# Patient Record
Sex: Male | Born: 1955
Health system: Southern US, Community
[De-identification: ages and names within clinical notes are randomized; demographics above are authoritative.]

## PROBLEM LIST (undated history)

## (undated) DIAGNOSIS — G4730 Sleep apnea, unspecified: Secondary | ICD-10-CM

## (undated) DIAGNOSIS — M1990 Unspecified osteoarthritis, unspecified site: Secondary | ICD-10-CM

## (undated) DIAGNOSIS — E785 Hyperlipidemia, unspecified: Secondary | ICD-10-CM

## (undated) DIAGNOSIS — E669 Obesity, unspecified: Secondary | ICD-10-CM

## (undated) HISTORY — PX: KNEE ARTHROSCOPY: SUR90

## (undated) HISTORY — PX: REFRACTIVE SURGERY: SHX103

## (undated) HISTORY — DX: Hyperlipidemia, unspecified: E78.5

## (undated) HISTORY — DX: Obesity, unspecified: E66.9

## (undated) HISTORY — PX: HERNIA REPAIR: SHX51

---

## 2004-12-25 ENCOUNTER — Ambulatory Visit (HOSPITAL_COMMUNITY): Admission: RE | Admit: 2004-12-25 | Discharge: 2004-12-25 | Payer: Self-pay | Admitting: Specialist

## 2013-07-26 DIAGNOSIS — Z872 Personal history of diseases of the skin and subcutaneous tissue: Secondary | ICD-10-CM | POA: Insufficient documentation

## 2014-08-17 DIAGNOSIS — G4733 Obstructive sleep apnea (adult) (pediatric): Secondary | ICD-10-CM | POA: Insufficient documentation

## 2014-08-17 DIAGNOSIS — E6609 Other obesity due to excess calories: Secondary | ICD-10-CM | POA: Diagnosis present

## 2014-08-17 DIAGNOSIS — E669 Obesity, unspecified: Secondary | ICD-10-CM | POA: Diagnosis present

## 2016-06-11 DIAGNOSIS — K4090 Unilateral inguinal hernia, without obstruction or gangrene, not specified as recurrent: Secondary | ICD-10-CM | POA: Insufficient documentation

## 2017-05-27 NOTE — Progress Notes (Signed)
Clearance on chart from Marylene Landaniel Bryan, Pac 04/29/17 on chart  EKG 04/29/17 on chart

## 2017-05-27 NOTE — Patient Instructions (Addendum)
James Burch  05/27/2017   Your procedure is scheduled on: 06/02/17  Report to [facility] Main  Entrance   Report to admitting at      1045  AM   Call this number if you have problems the morning of surgery  [phone]   Remember: ONLY 1 PERSON MAY GO WITH YOU TO SHORT STAY TO GET  READY MORNING OF YOUR SURGERY.  Do not eat food  :After Midnight.  YOU MAY HAVE CLEAR LIQUIDS UNTIL 0715 THEN NOTHING BY MOUTH     Take these medicines the morning of surgery with A SIP OF WATER: none                                You may not have any metal on your body including hair pins and              piercings  Do not wear jewelry,, lotions, powders or perfumes, deodorant               Men may shave face and neck.   Do not bring valuables to the hospital. [facility] IS NOT             RESPONSIBLE   FOR VALUABLES.  Contacts, dentures or bridgework may not be worn into surgery.  Leave suitcase in the car. After surgery it may be brought to your room.                 Please read over the following fact sheets you were given: _____________________________________________________________________           [facility] - Preparing for Surgery Before surgery, you can play an important role.  Because skin is not sterile, your skin needs to be as free of germs as possible.  You can reduce the number of germs on your skin by washing with CHG (chlorahexidine gluconate) soap before surgery.  CHG is an antiseptic cleaner which kills germs and bonds with the skin to continue killing germs even after washing. Please DO NOT use if you have an allergy to CHG or antibacterial soaps.  If your skin becomes reddened/irritated stop using the CHG and inform your nurse when you arrive at Short Stay. Do not shave (including legs and underarms) for at least 48 hours prior to the first CHG shower.  You may shave your face/neck. Please follow these instructions carefully:  1.  Shower  with CHG Soap the night before surgery and the  morning of Surgery.  2.  If you choose to wash your hair, wash your hair first as usual with your  normal  shampoo.  3.  After you shampoo, rinse your hair and body thoroughly to remove the  shampoo.                           4.  Use CHG as you would any other liquid soap.  You can apply chg directly  to the skin and wash                       Gently with a scrungie or clean washcloth.  5.  Apply the CHG Soap to your body ONLY FROM THE NECK DOWN.   Do not use on face/ open  Wound or open sores. Avoid contact with eyes, ears mouth and genitals (private parts).                       Wash face,  Genitals (private parts) with your normal soap.             6.  Wash thoroughly, paying special attention to the area where your surgery  will be performed.  7.  Thoroughly rinse your body with warm water from the neck down.  8.  DO NOT shower/wash with your normal soap after using and rinsing off  the CHG Soap.                9.  Pat yourself dry with a clean towel.            10.  Wear clean pajamas.            11.  Place clean sheets on your bed the night of your first shower and do not  sleep with pets. Day of Surgery : Do not apply any lotions/deodorants the morning of surgery.  Please wear clean clothes to the hospital/surgery center.  FAILURE TO FOLLOW THESE INSTRUCTIONS MAY RESULT IN THE CANCELLATION OF YOUR SURGERY PATIENT SIGNATURE_________________________________  NURSE SIGNATURE__________________________________  ________________________________________________________________________    CLEAR LIQUID DIET  Till 0715 am then nothing by mouth   Foods Allowed                                                                     Foods Excluded  Coffee and tea, regular and decaf                             liquids that you cannot  Plain Jell-O in any flavor                                             see through such  as: Fruit ices (not with fruit pulp)                                     milk, soups, orange juice  Iced Popsicles                                    All solid food Carbonated beverages, regular and diet                                    Cranberry, grape and apple juices Sports drinks like Gatorade Lightly seasoned clear broth or consume(fat free) Sugar, honey syrup  Sample Menu Breakfast                                Lunch  Supper Cranberry juice                    Beef broth                            Chicken broth Jell-O                                     Grape juice                           Apple juice Coffee or tea                        Jell-O                                      Popsicle                                                Coffee or tea                        Coffee or tea  _____________________________________________________________________    Incentive Spirometer  An incentive spirometer is a tool that can help keep your lungs clear and active. This tool measures how well you are filling your lungs with each breath. Taking long deep breaths may help reverse or decrease the chance of developing breathing (pulmonary) problems (especially infection) following:  A long period of time when you are unable to move or be active. BEFORE THE PROCEDURE   If the spirometer includes an indicator to show your best effort, your nurse or respiratory therapist will set it to a desired goal.  If possible, sit up straight or lean slightly forward. Try not to slouch.  Hold the incentive spirometer in an upright position. INSTRUCTIONS FOR USE  1. Sit on the edge of your bed if possible, or sit up as far as you can in bed or on a chair. 2. Hold the incentive spirometer in an upright position. 3. Breathe out normally. 4. Place the mouthpiece in your mouth and seal your lips tightly around it. 5. Breathe in slowly and as deeply as possible,  raising the piston or the ball toward the top of the column. 6. Hold your breath for 3-5 seconds or for as long as possible. Allow the piston or ball to fall to the bottom of the column. 7. Remove the mouthpiece from your mouth and breathe out normally. 8. Rest for a few seconds and repeat Steps 1 through 7 at least 10 times every 1-2 hours when you are awake. Take your time and take a few normal breaths between deep breaths. 9. The spirometer may include an indicator to show your best effort. Use the indicator as a goal to work toward during each repetition. 10. After each set of 10 deep breaths, practice coughing to be sure your lungs are clear. If you have an incision (the cut made at the time of surgery), support your incision when coughing by placing a pillow or rolled up towels firmly against  it. Once you are able to get out of bed, walk around indoors and cough well. You may stop using the incentive spirometer when instructed by your caregiver.  RISKS AND COMPLICATIONS  Take your time so you do not get dizzy or light-headed.  If you are in pain, you may need to take or ask for pain medication before doing incentive spirometry. It is harder to take a deep breath if you are having pain. AFTER USE  Rest and breathe slowly and easily.  It can be helpful to keep track of a log of your progress. Your caregiver can provide you with a simple table to help with this. If you are using the spirometer at home, follow these instructions: SEEK MEDICAL CARE IF:   You are having difficultly using the spirometer.  You have trouble using the spirometer as often as instructed.  Your pain medication is not giving enough relief while using the spirometer.  You develop fever of 100.5 F (38.1 C) or higher. SEEK IMMEDIATE MEDICAL CARE IF:   You cough up bloody sputum that had not been present before.  You develop fever of 102 F (38.9 C) or greater.  You develop worsening pain at or near the  incision site. MAKE SURE YOU:   Understand these instructions.  Will watch your condition.  Will get help right away if you are not doing well or get worse. Document Released: 11/24/2006 Document Revised: 10/06/2011 Document Reviewed: 01/25/2007 Exit[employer] Patient Information 2014 [employer], [place].   ________________________________________________________________________  WHAT IS A BLOOD TRANSFUSION? Blood Transfusion Information  A transfusion is the replacement of blood or some of its parts. Blood is made up of multiple cells which provide different functions.  Red blood cells carry oxygen and are used for blood loss replacement.  White blood cells fight against infection.  Platelets control bleeding.  Plasma helps clot blood.  Other blood products are available for specialized needs, such as hemophilia or other clotting disorders. BEFORE THE TRANSFUSION  Who gives blood for transfusions?   Healthy volunteers who are fully evaluated to make sure their blood is safe. This is blood bank blood. Transfusion therapy is the safest it has ever been in the practice of medicine. Before blood is taken from a donor, a complete history is taken to make sure that person has no history of diseases nor engages in risky social behavior (examples are intravenous drug use or sexual activity with multiple partners). The donor's travel history is screened to minimize risk of transmitting infections, such as malaria. The donated blood is tested for signs of infectious diseases, such as HIV and hepatitis. The blood is then tested to be sure it is compatible with you in order to minimize the chance of a transfusion reaction. If you or a relative donates blood, this is often done in anticipation of surgery and is not appropriate for emergency situations. It takes many days to process the donated blood. RISKS AND COMPLICATIONS Although transfusion therapy is very safe and saves many lives, the main dangers  of transfusion include:   Getting an infectious disease.  Developing a transfusion reaction. This is an allergic reaction to something in the blood you were given. Every precaution is taken to prevent this. The decision to have a blood transfusion has been considered carefully by your caregiver before blood is given. Blood is not given unless the benefits outweigh the risks. AFTER THE TRANSFUSION  Right after receiving a blood transfusion, you will usually feel much better and more  energetic. This is especially true if your red blood cells have gotten low (anemic). The transfusion raises the level of the red blood cells which carry oxygen, and this usually causes an energy increase.  The nurse administering the transfusion will monitor you carefully for complications. HOME CARE INSTRUCTIONS  No special instructions are needed after a transfusion. You may find your energy is better. Speak with your caregiver about any limitations on activity for underlying diseases you may have. SEEK MEDICAL CARE IF:   Your condition is not improving after your transfusion.  You develop redness or irritation at the intravenous (IV) site. SEEK IMMEDIATE MEDICAL CARE IF:  Any of the following symptoms occur over the next 12 hours:  Shaking chills.  You have a temperature by mouth above 102 F (38.9 C), not controlled by medicine.  Chest, back, or muscle pain.  People around you feel you are not acting correctly or are confused.  Shortness of breath or difficulty breathing.  Dizziness and fainting.  You get a rash or develop hives.  You have a decrease in urine output.  Your urine turns a dark color or changes to pink, red, or brown. Any of the following symptoms occur over the next 10 days:  You have a temperature by mouth above 102 F (38.9 C), not controlled by medicine.  Shortness of breath.  Weakness after normal activity.  The white part of the eye turns yellow (jaundice).  You  have a decrease in the amount of urine or are urinating less often.  Your urine turns a dark color or changes to pink, red, or brown. Document Released: 07/11/2000 Document Revised: 10/06/2011 Document Reviewed: 02/28/2008 [facility] Patient Information 2014 [place], [place].  _______________________________________________________________________

## 2017-05-28 NOTE — H&P (Signed)
TOTAL KNEE ADMISSION H&P  Patient is being admitted for bilateral total knee arthroplasties.  Subjective:  Chief Complaint:    Bilateral knee primary OA / pain  HPI: James Burch, 61 y.o. male, has a history of pain and functional disability in the bilateral knee due to arthritis and has failed non-surgical conservative treatments for greater than 12 weeks to include NSAID's and/or analgesics, corticosteriod injections and activity modification.  Onset of symptoms was gradual, starting  years ago with gradually worsening course since that time. The patient noted prior procedures on the knee to include  arthroscopy and menisectomy on the left knee(s).  Patient currently rates pain in the bilateral knee(s) at 7 out of 10 with activity. Patient has night pain, worsening of pain with activity and weight bearing, pain that interferes with activities of daily living, pain with passive range of motion, crepitus and joint swelling.  Patient has evidence of periarticular osteophytes and joint space narrowing by imaging studies.  There is no active infection.  Risks, benefits and expectations were discussed with the patient.  Risks including but not limited to the risk of anesthesia, blood clots, nerve damage, blood vessel damage, failure of the prosthesis, infection and up to and including death.  Patient understand the risks, benefits and expectations and wishes to proceed with surgery.   PCP: Etta Quill, PA-C  D/C Plans:       Home w/ HHPT  Post-op Meds:       No Rx given  Tranexamic Acid:      To be given - IV   Decadron:      Is to be given  FYI:     Xarelto then ASA  Oxycodone  CPAP   DME:   Rx given for - RW   PT:   HHPT      Past Medical History:  Diagnosis Date  . Arthritis   . Sleep apnea    cpap    Past Surgical History:  Procedure Laterality Date  . EYE SURGERY     lasik  . HERNIA REPAIR     Left inguinal  . KNEE ARTHROSCOPY     Left    No current  facility-administered medications for this encounter.    Current Outpatient Medications  Medication Sig Dispense Refill Last Dose  . glucosamine-chondroitin 500-400 MG tablet Take 1 tablet by mouth daily.     . Omega-3 Fatty Acids (FISH OIL PO) Take 1 capsule by mouth daily.     . pravastatin (PRAVACHOL) 40 MG tablet Take 40 mg by mouth daily.      No Known Allergies   Social History   Tobacco Use  . Smoking status: Never Smoker  . Smokeless tobacco: Never Used  Substance Use Topics  . Alcohol use: No       Review of Systems  Constitutional: Negative.   HENT: Negative.   Eyes: Negative.   Respiratory: Negative.   Cardiovascular: Negative.   Gastrointestinal: Negative.   Genitourinary: Negative.   Musculoskeletal: Positive for back pain and joint pain.  Skin: Negative.   Neurological: Negative.   Endo/Heme/Allergies: Negative.   Psychiatric/Behavioral: Negative.     Objective:  Physical Exam  Constitutional: He is oriented to person, place, and time. He appears well-developed.  HENT:  Head: Normocephalic.  Eyes: Pupils are equal, round, and reactive to light.  Neck: Neck supple. No JVD present. No tracheal deviation present. No thyromegaly present.  Cardiovascular: Normal rate, regular rhythm and intact distal pulses.  Respiratory: Effort normal and breath sounds normal. No respiratory distress. He has no wheezes.  GI: Soft. There is no tenderness. There is no guarding.  Musculoskeletal:       Right knee: He exhibits decreased range of motion, swelling and bony tenderness. He exhibits no ecchymosis, no deformity, no laceration and no erythema. Tenderness found.       Left knee: He exhibits decreased range of motion, swelling and bony tenderness. He exhibits no ecchymosis, no deformity, no laceration and no erythema. Tenderness found.  Lymphadenopathy:    He has no cervical adenopathy.  Neurological: He is alert and oriented to person, place, and time.  Skin: Skin  is warm and dry.  Psychiatric: He has a normal mood and affect.      Imaging Review Plain radiographs demonstrate severe degenerative joint disease of the bilateral knee(s).  The bone quality appears to be good for age and reported activity level.  Assessment/Plan:  End stage arthritis, bilateral knee   The patient history, physical examination, clinical judgment of the provider and imaging studies are consistent with end stage degenerative joint disease of the bilateral knee(s) and total knee arthroplasty is deemed medically necessary. The treatment options including medical management, injection therapy arthroscopy and arthroplasty were discussed at length. The risks and benefits of total knee arthroplasty were presented and reviewed. The risks due to aseptic loosening, infection, stiffness, patella tracking problems, thromboembolic complications and other imponderables were discussed. The patient acknowledged the explanation, agreed to proceed with the plan and consent was signed. Patient is being admitted for inpatient treatment for surgery, pain control, PT, OT, prophylactic antibiotics, VTE prophylaxis, progressive ambulation and ADL's and discharge planning. The patient is planning to be discharged home with home health services.    Anastasio AuerbachMatthew S. Gilberto Streck   PA-C  05/31/2017, 9:21 PM

## 2017-05-29 ENCOUNTER — Encounter (HOSPITAL_COMMUNITY)
Admission: RE | Admit: 2017-05-29 | Discharge: 2017-05-29 | Disposition: A | Discharge status: Home / Self Care | Payer: 59 | Source: Ambulatory Visit | Attending: Orthopedic Surgery | Admitting: Orthopedic Surgery

## 2017-05-29 ENCOUNTER — Encounter (HOSPITAL_COMMUNITY): Payer: Self-pay

## 2017-05-29 DIAGNOSIS — Z01812 Encounter for preprocedural laboratory examination: Secondary | ICD-10-CM | POA: Diagnosis present

## 2017-05-29 HISTORY — DX: Unspecified osteoarthritis, unspecified site: M19.90

## 2017-05-29 HISTORY — DX: Sleep apnea, unspecified: G47.30

## 2017-05-29 LAB — ABO/RH: ABO/RH(D): O POS

## 2017-05-29 LAB — BASIC METABOLIC PANEL
Anion gap: 8 (ref 5–15)
BUN: 21 mg/dL — AB (ref 6–20)
CHLORIDE: 103 mmol/L (ref 101–111)
CO2: 29 mmol/L (ref 22–32)
Calcium: 9.8 mg/dL (ref 8.9–10.3)
Creatinine, Ser: 1.09 mg/dL (ref 0.61–1.24)
GFR calc Af Amer: >60 mL/min (ref >60)
GFR calc non Af Amer: >60 mL/min (ref >60)
Glucose, Bld: 95 mg/dL (ref 65–99)
POTASSIUM: 4.8 mmol/L (ref 3.5–5.1)
SODIUM: 140 mmol/L (ref 135–145)

## 2017-05-29 LAB — SURGICAL PCR SCREEN
MRSA, PCR: NEGATIVE
Staphylococcus aureus: NEGATIVE

## 2017-05-29 LAB — CBC
HEMATOCRIT: 46.3 % (ref 39.0–52.0)
Hemoglobin: 15.9 g/dL (ref 13.0–17.0)
MCH: 30.5 pg (ref 26.0–34.0)
MCHC: 34.3 g/dL (ref 30.0–36.0)
MCV: 88.9 fL (ref 78.0–100.0)
Platelets: 190 10*3/uL (ref 150–400)
RBC: 5.21 MIL/uL (ref 4.22–5.81)
RDW: 12.6 % (ref 11.5–15.5)
WBC: 8.3 10*3/uL (ref 4.0–10.5)

## 2017-06-02 ENCOUNTER — Encounter (HOSPITAL_COMMUNITY): Payer: Self-pay | Admitting: *Deleted

## 2017-06-02 ENCOUNTER — Inpatient Hospital Stay (HOSPITAL_COMMUNITY)
Admission: RE | Admit: 2017-06-02 | Discharge: 2017-06-05 | DRG: 462 | Disposition: A | Discharge status: Home Health | Payer: 59 | Source: Ambulatory Visit | Attending: Orthopedic Surgery | Admitting: Orthopedic Surgery

## 2017-06-02 ENCOUNTER — Other Ambulatory Visit: Payer: Self-pay

## 2017-06-02 ENCOUNTER — Encounter (HOSPITAL_COMMUNITY)
Admission: RE | Disposition: A | Discharge status: Home Health | Payer: Self-pay | Source: Ambulatory Visit | Attending: Orthopedic Surgery

## 2017-06-02 ENCOUNTER — Inpatient Hospital Stay (HOSPITAL_COMMUNITY): Payer: 59 | Admitting: Anesthesiology

## 2017-06-02 DIAGNOSIS — M170 Bilateral primary osteoarthritis of knee: Principal | ICD-10-CM | POA: Diagnosis present

## 2017-06-02 DIAGNOSIS — K5900 Constipation, unspecified: Secondary | ICD-10-CM | POA: Diagnosis not present

## 2017-06-02 DIAGNOSIS — E66812 Obesity, class 2: Secondary | ICD-10-CM | POA: Diagnosis present

## 2017-06-02 DIAGNOSIS — Z6837 Body mass index (BMI) 37.0-37.9, adult: Secondary | ICD-10-CM | POA: Diagnosis present

## 2017-06-02 DIAGNOSIS — I951 Orthostatic hypotension: Secondary | ICD-10-CM | POA: Diagnosis not present

## 2017-06-02 DIAGNOSIS — G4730 Sleep apnea, unspecified: Secondary | ICD-10-CM | POA: Diagnosis present

## 2017-06-02 DIAGNOSIS — E669 Obesity, unspecified: Secondary | ICD-10-CM | POA: Diagnosis present

## 2017-06-02 DIAGNOSIS — R110 Nausea: Secondary | ICD-10-CM | POA: Diagnosis not present

## 2017-06-02 DIAGNOSIS — Z6834 Body mass index (BMI) 34.0-34.9, adult: Secondary | ICD-10-CM

## 2017-06-02 DIAGNOSIS — Z96659 Presence of unspecified artificial knee joint: Secondary | ICD-10-CM

## 2017-06-02 DIAGNOSIS — M659 Synovitis and tenosynovitis, unspecified: Secondary | ICD-10-CM | POA: Diagnosis present

## 2017-06-02 DIAGNOSIS — Z96653 Presence of artificial knee joint, bilateral: Secondary | ICD-10-CM

## 2017-06-02 HISTORY — PX: TOTAL KNEE ARTHROPLASTY: SHX125

## 2017-06-02 LAB — TYPE AND SCREEN
ABO/RH(D): O POS
ANTIBODY SCREEN: NEGATIVE

## 2017-06-02 SURGERY — ARTHROPLASTY, KNEE, BILATERAL, TOTAL
Anesthesia: Spinal | Site: Knee | Laterality: Bilateral

## 2017-06-02 MED ORDER — DEXAMETHASONE SODIUM PHOSPHATE 10 MG/ML IJ SOLN
10.0000 mg | Freq: Once | INTRAMUSCULAR | Status: AC
  Administered 2017-06-02: 10 mg via INTRAVENOUS

## 2017-06-02 MED ORDER — PHENOL 1.4 % MT LIQD: 1.0000 | OROMUCOSAL | Status: DC | PRN

## 2017-06-02 MED ORDER — POLYETHYLENE GLYCOL 3350 17 G PO PACK
17.0000 g | PACK | Freq: Two times a day (BID) | ORAL | Status: DC
  Administered 2017-06-03 – 2017-06-04 (×3): 17 g via ORAL
  Filled 2017-06-02 (×3): qty 1

## 2017-06-02 MED ORDER — BUPIVACAINE-EPINEPHRINE (PF) 0.25% -1:200000 IJ SOLN
INTRAMUSCULAR | Status: DC | PRN
  Administered 2017-06-02 (×2): 15 mL

## 2017-06-02 MED ORDER — MENTHOL 3 MG MT LOZG
1.0000 | LOZENGE | OROMUCOSAL | Status: DC | PRN
Start: 2017-06-02 — End: 2017-06-05

## 2017-06-02 MED ORDER — PRAVASTATIN SODIUM 20 MG PO TABS
40.0000 mg | ORAL_TABLET | Freq: Every day | ORAL | Status: DC
  Administered 2017-06-03 – 2017-06-05 (×3): 40 mg via ORAL
  Filled 2017-06-02 (×3): qty 2

## 2017-06-02 MED ORDER — SODIUM CHLORIDE 0.9 % IJ SOLN
INTRAMUSCULAR | Status: DC | PRN
  Administered 2017-06-02 (×2): 15 mL

## 2017-06-02 MED ORDER — STERILE WATER FOR IRRIGATION IR SOLN
Status: DC | PRN
  Administered 2017-06-02: 3000 mL

## 2017-06-02 MED ORDER — BUPIVACAINE HCL (PF) 0.5 % IJ SOLN
INTRAMUSCULAR | Status: DC | PRN
  Administered 2017-06-02: 3 mL

## 2017-06-02 MED ORDER — OXYCODONE HCL 5 MG PO TABS
5.0000 mg | ORAL_TABLET | ORAL | Status: DC | PRN
  Administered 2017-06-02: 5 mg via ORAL
  Filled 2017-06-02: qty 1

## 2017-06-02 MED ORDER — TRANEXAMIC ACID 1000 MG/10ML IV SOLN
1000.0000 mg | INTRAVENOUS | Status: AC
  Administered 2017-06-02: 1000 mg via INTRAVENOUS
  Filled 2017-06-02: qty 1100

## 2017-06-02 MED ORDER — CHLORHEXIDINE GLUCONATE 4 % EX LIQD: 60.0000 mL | Freq: Once | CUTANEOUS | Status: DC

## 2017-06-02 MED ORDER — SODIUM CHLORIDE 0.9 % IR SOLN
Status: DC | PRN
  Administered 2017-06-02 (×2): 1000 mL

## 2017-06-02 MED ORDER — LACTATED RINGERS IV SOLN
INTRAVENOUS | Status: DC
  Administered 2017-06-02 (×4): via INTRAVENOUS

## 2017-06-02 MED ORDER — DEXAMETHASONE SODIUM PHOSPHATE 10 MG/ML IJ SOLN
INTRAMUSCULAR | Status: AC
  Filled 2017-06-02: qty 1

## 2017-06-02 MED ORDER — PROPOFOL 10 MG/ML IV BOLUS
INTRAVENOUS | Status: AC
  Filled 2017-06-02: qty 20

## 2017-06-02 MED ORDER — ONDANSETRON HCL 4 MG PO TABS
4.0000 mg | ORAL_TABLET | Freq: Four times a day (QID) | ORAL | Status: DC | PRN
  Administered 2017-06-03: 4 mg via ORAL
  Filled 2017-06-02: qty 1

## 2017-06-02 MED ORDER — METOCLOPRAMIDE HCL 5 MG/ML IJ SOLN
5.0000 mg | Freq: Three times a day (TID) | INTRAMUSCULAR | Status: DC | PRN
  Administered 2017-06-03: 10 mg via INTRAVENOUS
  Filled 2017-06-02: qty 2

## 2017-06-02 MED ORDER — SODIUM CHLORIDE 0.9 % IV SOLN
1000.0000 mg | Freq: Once | INTRAVENOUS | Status: AC
  Administered 2017-06-02: 1000 mg via INTRAVENOUS
  Filled 2017-06-02: qty 1100

## 2017-06-02 MED ORDER — CEFAZOLIN SODIUM-DEXTROSE 2-4 GM/100ML-% IV SOLN
2.0000 g | INTRAVENOUS | Status: AC
Start: 2017-06-02 — End: 2017-06-02
  Administered 2017-06-02: 2 g via INTRAVENOUS

## 2017-06-02 MED ORDER — CEFAZOLIN SODIUM-DEXTROSE 2-4 GM/100ML-% IV SOLN
2.0000 g | Freq: Four times a day (QID) | INTRAVENOUS | Status: AC
  Administered 2017-06-02 – 2017-06-03 (×2): 2 g via INTRAVENOUS
  Filled 2017-06-02 (×2): qty 100

## 2017-06-02 MED ORDER — PROMETHAZINE HCL 25 MG/ML IJ SOLN: 6.2500 mg | INTRAMUSCULAR | Status: DC | PRN

## 2017-06-02 MED ORDER — KETOROLAC TROMETHAMINE 30 MG/ML IJ SOLN: 30.0000 mg | Freq: Once | INTRAMUSCULAR | Status: DC | PRN

## 2017-06-02 MED ORDER — PHENYLEPHRINE HCL 10 MG/ML IJ SOLN
INTRAMUSCULAR | Status: DC | PRN
  Administered 2017-06-02: 30 ug/min via INTRAVENOUS

## 2017-06-02 MED ORDER — MAGNESIUM CITRATE PO SOLN: 1.0000 | Freq: Once | ORAL | Status: DC | PRN

## 2017-06-02 MED ORDER — DIPHENHYDRAMINE HCL 12.5 MG/5ML PO ELIX: 12.5000 mg | ORAL_SOLUTION | ORAL | Status: DC | PRN

## 2017-06-02 MED ORDER — HYDROMORPHONE HCL 1 MG/ML IJ SOLN
0.5000 mg | INTRAMUSCULAR | Status: DC | PRN
  Administered 2017-06-03 – 2017-06-05 (×3): 1 mg via INTRAVENOUS
  Filled 2017-06-02 (×3): qty 1

## 2017-06-02 MED ORDER — SODIUM CHLORIDE 0.9 % IR SOLN
Status: DC | PRN
  Administered 2017-06-02: 1000 mL

## 2017-06-02 MED ORDER — KETOROLAC TROMETHAMINE 30 MG/ML IJ SOLN
INTRAMUSCULAR | Status: AC
  Filled 2017-06-02: qty 1

## 2017-06-02 MED ORDER — ACETAMINOPHEN 325 MG PO TABS
650.0000 mg | ORAL_TABLET | ORAL | Status: DC | PRN
  Administered 2017-06-04: 650 mg via ORAL
  Filled 2017-06-02: qty 2

## 2017-06-02 MED ORDER — METHOCARBAMOL 500 MG PO TABS
500.0000 mg | ORAL_TABLET | Freq: Four times a day (QID) | ORAL | Status: DC | PRN
  Administered 2017-06-02 – 2017-06-05 (×8): 500 mg via ORAL
  Filled 2017-06-02 (×9): qty 1

## 2017-06-02 MED ORDER — FERROUS SULFATE 325 (65 FE) MG PO TABS
325.0000 mg | ORAL_TABLET | Freq: Three times a day (TID) | ORAL | Status: DC
  Administered 2017-06-03 – 2017-06-05 (×7): 325 mg via ORAL
  Filled 2017-06-02 (×7): qty 1

## 2017-06-02 MED ORDER — ACETAMINOPHEN 650 MG RE SUPP: 650.0000 mg | RECTAL | Status: DC | PRN

## 2017-06-02 MED ORDER — DEXAMETHASONE SODIUM PHOSPHATE 10 MG/ML IJ SOLN
10.0000 mg | Freq: Once | INTRAMUSCULAR | Status: AC
  Administered 2017-06-03: 10 mg via INTRAVENOUS
  Filled 2017-06-02: qty 1

## 2017-06-02 MED ORDER — ROCURONIUM BROMIDE 50 MG/5ML IV SOSY
PREFILLED_SYRINGE | INTRAVENOUS | Status: AC
  Filled 2017-06-02: qty 5

## 2017-06-02 MED ORDER — DOCUSATE SODIUM 100 MG PO CAPS
100.0000 mg | ORAL_CAPSULE | Freq: Two times a day (BID) | ORAL | Status: DC
Start: 2017-06-02 — End: 2017-06-05
  Administered 2017-06-02 – 2017-06-05 (×6): 100 mg via ORAL
  Filled 2017-06-02 (×6): qty 1

## 2017-06-02 MED ORDER — PROPOFOL 10 MG/ML IV BOLUS
INTRAVENOUS | Status: DC | PRN
  Administered 2017-06-02: 40 mg via INTRAVENOUS

## 2017-06-02 MED ORDER — ONDANSETRON HCL 4 MG/2ML IJ SOLN
INTRAMUSCULAR | Status: AC
  Filled 2017-06-02: qty 2

## 2017-06-02 MED ORDER — RIVAROXABAN 10 MG PO TABS
10.0000 mg | ORAL_TABLET | ORAL | Status: DC
  Administered 2017-06-03 – 2017-06-05 (×3): 10 mg via ORAL
  Filled 2017-06-02 (×3): qty 1

## 2017-06-02 MED ORDER — PROPOFOL 500 MG/50ML IV EMUL
INTRAVENOUS | Status: DC | PRN
  Administered 2017-06-02: 100 ug/kg/min via INTRAVENOUS

## 2017-06-02 MED ORDER — SODIUM CHLORIDE 0.9 % IV SOLN
INTRAVENOUS | Status: DC
  Administered 2017-06-02: 20:00:00 via INTRAVENOUS

## 2017-06-02 MED ORDER — FENTANYL CITRATE (PF) 100 MCG/2ML IJ SOLN
INTRAMUSCULAR | Status: AC
  Administered 2017-06-02: 50 ug via INTRAVENOUS
  Filled 2017-06-02: qty 2

## 2017-06-02 MED ORDER — SODIUM CHLORIDE 0.9 % IJ SOLN
INTRAMUSCULAR | Status: AC
  Filled 2017-06-02: qty 50

## 2017-06-02 MED ORDER — OXYCODONE HCL 5 MG PO TABS
10.0000 mg | ORAL_TABLET | ORAL | Status: DC | PRN
  Administered 2017-06-02 – 2017-06-03 (×7): 10 mg via ORAL
  Filled 2017-06-02 (×8): qty 2

## 2017-06-02 MED ORDER — ONDANSETRON HCL 4 MG/2ML IJ SOLN
4.0000 mg | Freq: Four times a day (QID) | INTRAMUSCULAR | Status: DC | PRN
  Administered 2017-06-03: 4 mg via INTRAVENOUS
  Filled 2017-06-02: qty 2

## 2017-06-02 MED ORDER — MIDAZOLAM HCL 2 MG/2ML IJ SOLN
INTRAMUSCULAR | Status: AC
  Administered 2017-06-02: 2 mg via INTRAVENOUS
  Filled 2017-06-02: qty 2

## 2017-06-02 MED ORDER — ALUM & MAG HYDROXIDE-SIMETH 200-200-20 MG/5ML PO SUSP: 15.0000 mL | ORAL | Status: DC | PRN

## 2017-06-02 MED ORDER — MIDAZOLAM HCL 2 MG/2ML IJ SOLN
INTRAMUSCULAR | Status: AC
  Filled 2017-06-02: qty 2

## 2017-06-02 MED ORDER — BUPIVACAINE-EPINEPHRINE (PF) 0.25% -1:200000 IJ SOLN
INTRAMUSCULAR | Status: AC
  Filled 2017-06-02: qty 30

## 2017-06-02 MED ORDER — BISACODYL 10 MG RE SUPP: 10.0000 mg | Freq: Every day | RECTAL | Status: DC | PRN

## 2017-06-02 MED ORDER — ROPIVACAINE HCL 7.5 MG/ML IJ SOLN
INTRAMUSCULAR | Status: DC | PRN
  Administered 2017-06-02 (×2): 20 mL via PERINEURAL

## 2017-06-02 MED ORDER — MEPERIDINE HCL 50 MG/ML IJ SOLN: 6.2500 mg | INTRAMUSCULAR | Status: DC | PRN

## 2017-06-02 MED ORDER — METOCLOPRAMIDE HCL 5 MG PO TABS
5.0000 mg | ORAL_TABLET | Freq: Three times a day (TID) | ORAL | Status: DC | PRN
Start: 2017-06-02 — End: 2017-06-05
  Administered 2017-06-05: 10 mg via ORAL
  Filled 2017-06-02: qty 2

## 2017-06-02 MED ORDER — FENTANYL CITRATE (PF) 100 MCG/2ML IJ SOLN
INTRAMUSCULAR | Status: DC | PRN
Start: 2017-06-02 — End: 2017-06-02
  Administered 2017-06-02 (×2): 50 ug via INTRAVENOUS

## 2017-06-02 MED ORDER — ONDANSETRON HCL 4 MG/2ML IJ SOLN
INTRAMUSCULAR | Status: DC | PRN
  Administered 2017-06-02: 4 mg via INTRAVENOUS

## 2017-06-02 MED ORDER — CELECOXIB 200 MG PO CAPS
200.0000 mg | ORAL_CAPSULE | Freq: Two times a day (BID) | ORAL | Status: DC
  Administered 2017-06-02 – 2017-06-05 (×6): 200 mg via ORAL
  Filled 2017-06-02 (×6): qty 1

## 2017-06-02 MED ORDER — KETOROLAC TROMETHAMINE 30 MG/ML IJ SOLN
INTRAMUSCULAR | Status: DC | PRN
  Administered 2017-06-02 (×2): 15 mg

## 2017-06-02 MED ORDER — MIDAZOLAM HCL 2 MG/2ML IJ SOLN
2.0000 mg | Freq: Once | INTRAMUSCULAR | Status: AC
  Administered 2017-06-02: 2 mg via INTRAVENOUS

## 2017-06-02 MED ORDER — HYDROMORPHONE HCL 1 MG/ML IJ SOLN: 0.2500 mg | INTRAMUSCULAR | Status: DC | PRN

## 2017-06-02 MED ORDER — DEXTROSE 5 % IV SOLN
500.0000 mg | Freq: Four times a day (QID) | INTRAVENOUS | Status: DC | PRN
  Administered 2017-06-02: 500 mg via INTRAVENOUS
  Filled 2017-06-02: qty 550

## 2017-06-02 MED ORDER — FENTANYL CITRATE (PF) 100 MCG/2ML IJ SOLN
100.0000 ug | Freq: Once | INTRAMUSCULAR | Status: AC
  Administered 2017-06-02: 50 ug via INTRAVENOUS

## 2017-06-02 MED ORDER — FENTANYL CITRATE (PF) 100 MCG/2ML IJ SOLN
INTRAMUSCULAR | Status: AC
  Filled 2017-06-02: qty 2

## 2017-06-02 MED ORDER — MIDAZOLAM HCL 5 MG/5ML IJ SOLN
INTRAMUSCULAR | Status: DC | PRN
  Administered 2017-06-02: 1 mg via INTRAVENOUS

## 2017-06-02 MED ORDER — PHENYLEPHRINE HCL 10 MG/ML IJ SOLN
INTRAMUSCULAR | Status: AC
  Filled 2017-06-02: qty 1

## 2017-06-02 MED ORDER — CEFAZOLIN SODIUM-DEXTROSE 2-4 GM/100ML-% IV SOLN
INTRAVENOUS | Status: AC
  Filled 2017-06-02: qty 100

## 2017-06-02 MED ORDER — PROPOFOL 10 MG/ML IV BOLUS
INTRAVENOUS | Status: AC
  Filled 2017-06-02: qty 40

## 2017-06-02 SURGICAL SUPPLY — 55 items
ADH SKN CLS APL DERMABOND .7 (GAUZE/BANDAGES/DRESSINGS) ×2
BAG SPEC THK2 15X12 ZIP CLS (MISCELLANEOUS)
BAG ZIPLOCK 12X15 (MISCELLANEOUS) IMPLANT
BANDAGE ACE 6X5 VEL STRL LF (GAUZE/BANDAGES/DRESSINGS) ×6 IMPLANT
BANDAGE ESMARK 6X9 LF (GAUZE/BANDAGES/DRESSINGS) ×1 IMPLANT
BLADE SAW SGTL 13.0X1.19X90.0M (BLADE) ×6 IMPLANT
BLADE SURG SZ10 CARB STEEL (BLADE) IMPLANT
BNDG CMPR 9X6 STRL LF SNTH (GAUZE/BANDAGES/DRESSINGS) ×1
BNDG COHESIVE 4X5 TAN STRL (GAUZE/BANDAGES/DRESSINGS) ×3 IMPLANT
BNDG ESMARK 6X9 LF (GAUZE/BANDAGES/DRESSINGS) ×3
BOWL SMART MIX CTS (DISPOSABLE) ×6 IMPLANT
CAPT KNEE TOTAL 3 ATTUNE ×6 IMPLANT
CEMENT HV SMART SET (Cement) ×12 IMPLANT
COVER SURGICAL LIGHT HANDLE (MISCELLANEOUS) ×3 IMPLANT
CUFF TOURN SGL QUICK 34 (TOURNIQUET CUFF) ×6
CUFF TRNQT CYL 34X4X40X1 (TOURNIQUET CUFF) ×2 IMPLANT
DECANTER SPIKE VIAL GLASS SM (MISCELLANEOUS) ×3 IMPLANT
DERMABOND ADVANCED (GAUZE/BANDAGES/DRESSINGS) ×4
DERMABOND ADVANCED .7 DNX12 (GAUZE/BANDAGES/DRESSINGS) ×2 IMPLANT
DRAPE EXTREMITY BILATERAL (DRAPES) ×3 IMPLANT
DRAPE INCISE IOBAN 66X45 STRL (DRAPES) ×3 IMPLANT
DRAPE U-SHAPE 47X51 STRL (DRAPES) ×9 IMPLANT
DRESSING AQUACEL AG SP 3.5X10 (GAUZE/BANDAGES/DRESSINGS) ×2 IMPLANT
DRSG AQUACEL AG SP 3.5X10 (GAUZE/BANDAGES/DRESSINGS) ×6
DURAPREP 26ML APPLICATOR (WOUND CARE) ×6 IMPLANT
ELECT REM PT RETURN 15FT ADLT (MISCELLANEOUS) ×3 IMPLANT
FACESHIELD WRAPAROUND (MASK) ×9 IMPLANT
FACESHIELD WRAPAROUND OR TEAM (MASK) ×3 IMPLANT
GLOVE BIOGEL M 7.0 STRL (GLOVE) IMPLANT
GLOVE BIOGEL PI IND STRL 7.5 (GLOVE) ×1 IMPLANT
GLOVE BIOGEL PI IND STRL 8.5 (GLOVE) ×1 IMPLANT
GLOVE BIOGEL PI INDICATOR 7.5 (GLOVE) ×12
GLOVE BIOGEL PI INDICATOR 8.5 (GLOVE) ×2
GLOVE ECLIPSE 8.0 STRL XLNG CF (GLOVE) ×6 IMPLANT
GLOVE ORTHO TXT STRL SZ7.5 (GLOVE) ×6 IMPLANT
GOWN STRL REUS W/TWL LRG LVL3 (GOWN DISPOSABLE) ×3 IMPLANT
GOWN STRL REUS W/TWL XL LVL3 (GOWN DISPOSABLE) ×3 IMPLANT
HANDPIECE INTERPULSE COAX TIP (DISPOSABLE) ×3
MANIFOLD NEPTUNE II (INSTRUMENTS) ×3 IMPLANT
NS IRRIG 1000ML POUR BTL (IV SOLUTION) ×3 IMPLANT
PACK TOTAL KNEE CUSTOM (KITS) ×3 IMPLANT
SET HNDPC FAN SPRY TIP SCT (DISPOSABLE) ×1 IMPLANT
SET PAD KNEE POSITIONER (MISCELLANEOUS) ×6 IMPLANT
SPONGE LAP 18X18 X RAY DECT (DISPOSABLE) ×6 IMPLANT
STOCKINETTE 8 INCH (MISCELLANEOUS) ×3 IMPLANT
SUT MNCRL AB 4-0 PS2 18 (SUTURE) ×6 IMPLANT
SUT VIC AB 1 CT1 36 (SUTURE) ×6 IMPLANT
SUT VIC AB 2-0 CT1 27 (SUTURE) ×12
SUT VIC AB 2-0 CT1 TAPERPNT 27 (SUTURE) ×4 IMPLANT
SUT VLOC 180 0 24IN GS25 (SUTURE) ×6 IMPLANT
SYRINGE 60CC LL (MISCELLANEOUS) ×3 IMPLANT
TRAY FOLEY W/METER SILVER 16FR (SET/KITS/TRAYS/PACK) ×3 IMPLANT
WATER STERILE IRR 1000ML POUR (IV SOLUTION) ×2 IMPLANT
WRAP KNEE MAXI GEL POST OP (GAUZE/BANDAGES/DRESSINGS) ×6 IMPLANT
YANKAUER SUCT BULB TIP 10FT TU (MISCELLANEOUS) IMPLANT

## 2017-06-02 NOTE — Anesthesia Postprocedure Evaluation (Signed)
Anesthesia Post Note  Patient: James RollerDavid D Eckman  Procedure(s) Performed: TOTAL KNEE BILATERAL (Bilateral Knee)     Patient location during evaluation: PACU Anesthesia Type: Spinal Level of consciousness: awake and alert Pain management: pain level controlled Vital Signs Assessment: post-procedure vital signs reviewed and stable Respiratory status: spontaneous breathing and respiratory function stable Cardiovascular status: blood pressure returned to baseline and stable Postop Assessment: spinal receding Anesthetic complications: no    Last Vitals:  Vitals:   06/02/17 1921 06/02/17 2004  BP: 133/82 (!) 141/84  Pulse: 64 64  Resp: 12 14  Temp: 36.8 C 36.5 C  SpO2: 98% 99%    Last Pain:  Vitals:   06/02/17 2004  TempSrc: Oral  PainSc:                  Lewie LoronJohn Idonia Zollinger

## 2017-06-02 NOTE — Anesthesia Procedure Notes (Signed)
Date/Time: 06/02/2017 12:43 PM Performed by: Illene SilverEvans, Jorgen Wolfinger E, CRNA Pre-anesthesia Checklist: Patient identified, Emergency Drugs available, Suction available and Patient being monitored Patient Re-evaluated:Patient Re-evaluated prior to induction Oxygen Delivery Method: Circle system utilized Placement Confirmation: positive ETCO2

## 2017-06-02 NOTE — Discharge Instructions (Addendum)

## 2017-06-02 NOTE — Interval H&P Note (Signed)
History and Physical Interval Note:  06/02/2017 11:37 AM  James Burch  has presented today for surgery, with the diagnosis of Bilateral knee osteoarthritis  The various methods of treatment have been discussed with the patient and family. After consideration of risks, benefits and other options for treatment, the patient has consented to  Procedure(s) with comments: TOTAL KNEE BILATERAL (Bilateral) - 2 hrs as a surgical intervention .  The patient's history has been reviewed, patient examined, no change in status, stable for surgery.  I have reviewed the patient's chart and labs.  Questions were answered to the patient's satisfaction.     Shelda PalLIN,Rosser Collington D

## 2017-06-02 NOTE — Transfer of Care (Signed)
Immediate Anesthesia Transfer of Care Note  Patient: James RollerDavid D Burch  Procedure(s) Performed: TOTAL KNEE BILATERAL (Bilateral Knee)  Patient Location: PACU  Anesthesia Type:Spinal  Level of Consciousness: awake, alert , oriented and patient cooperative  Airway & Oxygen Therapy: Patient Spontanous Breathing and Patient connected to face mask oxygen  Post-op Assessment: Report given to RN and Post -op Vital signs reviewed and stable  Post vital signs: stable  Last Vitals:  Vitals:   06/02/17 1205 06/02/17 1215  BP: (!) 128/94 (!) 131/92  Pulse: 79 79  Resp: 15 15  Temp:    SpO2: 97% 98%    Last Pain:  Vitals:   06/02/17 1114  TempSrc: Oral         Complications: No apparent anesthesia complications

## 2017-06-02 NOTE — Anesthesia Procedure Notes (Signed)
Anesthesia Regional Block: Adductor canal block   Pre-Anesthetic Checklist: ,, timeout performed, Correct Patient, Correct Site, Correct Laterality, Correct Procedure, Correct Position, site marked, Risks and benefits discussed,  Surgical consent,  Pre-op evaluation,  At surgeon's request and post-op pain management  Laterality: Left  Prep: chloraprep       Needles:  Injection technique: Single-shot  Needle Type: Stimiplex     Needle Length: 9cm  Needle Gauge: 21     Additional Needles:   Procedures:,,,, ultrasound used (permanent image in chart),,,,  Narrative:  Start time: 06/02/2017 11:55 AM End time: 06/02/2017 11:57 AM Injection made incrementally with aspirations every 5 mL.  Performed by: Personally  Anesthesiologist: Lewie LoronGermeroth, Lillyann Ahart, MD  Additional Notes: BP cuff, EKG monitors applied. Sedation begun. Artery and nerve location verified with U/S and anesthetic injected incrementally, slowly, and after negative aspirations under direct u/s guidance. Good fascial /perineural spread. Tolerated well.

## 2017-06-02 NOTE — Anesthesia Procedure Notes (Signed)
Anesthesia Regional Block: Adductor canal block   Pre-Anesthetic Checklist: ,, timeout performed, Correct Patient, Correct Site, Correct Laterality, Correct Procedure, Correct Position, site marked, Risks and benefits discussed,  Surgical consent,  Pre-op evaluation,  At surgeon's request and post-op pain management  Laterality: Right  Prep: chloraprep       Needles:  Injection technique: Single-shot  Needle Type: Stimiplex     Needle Length: 9cm  Needle Gauge: 21     Additional Needles:   Procedures:,,,, ultrasound used (permanent image in chart),,,,  Narrative:  Start time: 06/02/2017 11:52 AM End time: 06/02/2017 11:54 AM Injection made incrementally with aspirations every 5 mL.  Performed by: Personally  Anesthesiologist: Lewie LoronGermeroth, Jaleigh Mccroskey, MD  Additional Notes: BP cuff, EKG monitors applied. Sedation begun. Artery and nerve location verified with U/S and anesthetic injected incrementally, slowly, and after negative aspirations under direct u/s guidance. Good fascial /perineural spread. Tolerated well.

## 2017-06-02 NOTE — Op Note (Signed)
NAME:  KAZUO DURNIL                      MEDICAL RECORD NO.:  [id number]                             FACILITY:  [facility]      PHYSICIAN:  Madlyn Frankel. Charlann Boxer, M.D.  DATE OF BIRTH:  Apr 22, 1956      DATE OF PROCEDURE:  06/02/2017    **This was a simultaneously performed bilateral total knee replacements.  I started on the left then went to right during closure.  Below are two templated op notes with procedural information.  There were no complications on either knee.                                    OPERATIVE REPORT         PREOPERATIVE DIAGNOSIS:  Left knee osteoarthritis.      POSTOPERATIVE DIAGNOSIS:  Left knee osteoarthritis.      FINDINGS:  The patient was noted to have complete loss of cartilage and   bone-on-bone arthritis with associated osteophytes in the medial and patellofemoral compartments of   the knee with a significant synovitis and associated effusion.      PROCEDURE:  Left total knee replacement.      COMPONENTS USED:  DePuy Attune rotating platform posterior stabilized knee   system, a size 6 femur, 6 tibia, size 6 mm PS AOX insert, and 38 anatomic patellar   button.      SURGEON:  Madlyn Frankel. Charlann Boxer, M.D.      ASSISTANT:  Lanney Gins, PA-C.      ANESTHESIA:  Regional and Spinal.      SPECIMENS:  None.      COMPLICATION:  None.      DRAINS:  None.  EBL: <150cc      TOURNIQUET TIME:   Total Tourniquet Time Documented: Thigh (Left) - 36 minutes Total: Thigh (Left) - 36 minutes  Thigh (Right) - 36 minutes Total: Thigh (Right) - 36 minutes  .      The patient was stable to the recovery room.      INDICATION FOR PROCEDURE:  ARROW TOMKO is a 61 y.o. male patient of   mine.  The patient had been seen, evaluated, and treated conservatively in the   office with medication, activity modification, and injections.  The patient had   radiographic changes of bone-on-bone arthritis with endplate sclerosis and osteophytes noted.      The patient failed  conservative measures including medication, injections, and activity modification, and at this point was ready for more definitive measures.   Based on the radiographic changes and failed conservative measures, the patient   decided to proceed with total knee replacement.  Risks of infection,   DVT, component failure, need for revision surgery, postop course, and   expectations were all   discussed and reviewed.  Consent was obtained for benefit of pain   relief.      PROCEDURE IN DETAIL:  The patient was brought to the operative theater.   Once adequate anesthesia, preoperative antibiotics, 2 gm of Ancef, 1 gm of Tranexamic Acid, and 10 mg of Decadron administered, the patient was positioned supine with the right thigh tourniquet placed.  The  right lower extremity was prepped and draped in  sterile fashion.  A time-   out was performed identifying the patient, planned procedure, and   extremity.      The right lower extremity was placed in the [facility] leg holder.  The leg was   exsanguinated, tourniquet elevated to 250 mmHg.  A midline incision was   made followed by median parapatellar arthrotomy.  Following initial   exposure, attention was first directed to the patella.  Precut   measurement was noted to be 25 mm.  I resected down to 15 mm and used a   38 anatomic patellar button to restore patellar height as well as cover the cut   surface.      The lug holes were drilled and a metal shim was placed to protect the   patella from retractors and saw blades.      At this point, attention was now directed to the femur.  The femoral   canal was opened with a drill, irrigated to try to prevent fat emboli.  An   intramedullary rod was passed at 5 degrees valgus, 7 mm of bone was   resected off the distal femur.  Following this resection, the tibia was   subluxated anteriorly.  Using the extramedullary guide, 2 mm of bone was resected off   the proximal medial tibia.  We confirmed the gap  would be   stable medially and laterally with a size 6 spacer block as well as confirmed   the cut was perpendicular in the coronal plane, checking with an alignment rod.      Once this was done, I sized the femur to be a size 6 in the anterior-   posterior dimension, chose a standard component based on medial and   lateral dimension.  The size 6 rotation block was then pinned in   position anterior referenced using the C-clamp to set rotation.  The   anterior, posterior, and  chamfer cuts were made without difficulty nor   notching making certain that I was along the anterior cortex to help   with flexion gap stability.      The final box cut was made off the lateral aspect of distal femur.      At this point, the tibia was sized to be a size 6, the size 6 tray was   then pinned in position through the medial third of the tubercle,   drilled, and keel punched.  Trial reduction was now carried with a 6 femur,  6 tibia, a size 6 mm PS insert, and the 38 anatomic patella botton.  The knee was brought to   extension, full extension with good flexion stability with the patella   tracking through the trochlea without application of pressure.  Given   all these findings the femoral lug holes were drilled and then the trial components removed.  Final components were   opened and cement was mixed.  The knee was irrigated with normal saline   solution and pulse lavage.  The synovial lining was   then injected with 30 cc of a 60cc combination of 30cc 0.25% Marcaine with epinephrine and 1 cc of Toradol plis 30 cc of NS.      The knee was irrigated.  Final implants were then cemented onto clean and   dried cut surfaces of bone with the knee brought to extension with a size 6 mm trial insert.      Once the cement had fully cured, the excess cement was removed  throughout the knee.  I confirmed I was satisfied with the range of   motion and stability, and the final size 6 mm PS AOX insert was  chosen.  It was   placed into the knee.      The tourniquet had been let down at 36 minutes.  No significant   hemostasis required.  The   extensor mechanism was then reapproximated using #1 Vicryl and #0 Stratafix with the knee   in flexion.  The   remaining wound was closed with 2-0 Vicryl and running 4-0 Monocryl.   The knee was cleaned, dried, dressed sterilely using Dermabond and   Aquacel dressing.  The patient was then   brought to recovery room in stable condition, tolerating the procedure   well.   Please note that Physician Assistant, Lanney Gins, PA-C, was present for the entirety of the case, and was utilized for pre-operative positioning, peri-operative retractor management, general facilitation of the procedure.  He was also utilized for primary wound closure at the end of the case.              Madlyn Frankel Charlann Boxer, M.D.    06/02/2017 3:21 PM  NAME:  AVION KUTZER                      MEDICAL RECORD NO.:  [id number]                             FACILITY:  [facility]      PHYSICIAN:  Madlyn Frankel. Charlann Boxer, M.D.  DATE OF BIRTH:  Feb 05, 1956      DATE OF PROCEDURE:  06/02/2017                                     OPERATIVE REPORT         PREOPERATIVE DIAGNOSIS:  Right knee osteoarthritis.      POSTOPERATIVE DIAGNOSIS:  Right knee osteoarthritis.      FINDINGS:  The patient was noted to have complete loss of cartilage and   bone-on-bone arthritis with associated osteophytes in the medial and patellofemoral compartments of   the knee with a significant synovitis and associated effusion.      PROCEDURE:  Right total knee replacement.      COMPONENTS USED:  DePuy Attune rotating platform posterior stabilized knee   system, a size 6 femur, 6 tibia, size 7 mm PS AOX insert, and 38 anatomic patellar   button.      SURGEON:  Madlyn Frankel. Charlann Boxer, M.D.      ASSISTANT:  Lanney Gins, PA-C.      ANESTHESIA:  Regional and Spinal.      SPECIMENS:  None.      COMPLICATION:  None.       DRAINS:  None.  EBL: <150cc      TOURNIQUET TIME:   Total Tourniquet Time Documented: Thigh (Left) - 36 minutes Total: Thigh (Left) - 36 minutes  Thigh (Right) - 36 minutes Total: Thigh (Right) - 36 minutes  .      The patient was stable to the recovery room.      INDICATION FOR PROCEDURE:  NIKLAS CHRETIEN is a 61 y.o. male patient of   mine.  The patient had been seen, evaluated, and treated conservatively in the   office with medication, activity modification, and injections.  The patient had   radiographic changes of bone-on-bone arthritis with endplate sclerosis and osteophytes noted.      The patient failed conservative measures including medication, injections, and activity modification, and at this point was ready for more definitive measures.   Based on the radiographic changes and failed conservative measures, the patient   decided to proceed with total knee replacement.  Risks of infection,   DVT, component failure, need for revision surgery, postop course, and   expectations were all   discussed and reviewed.  Consent was obtained for benefit of pain   relief.      PROCEDURE IN DETAIL:  The patient was brought to the operative theater.   Once adequate anesthesia, preoperative antibiotics, 2 gm of Ancef, 1 gm of Tranexamic Acid, and 10 mg of Decadron administered, the patient was positioned supine with the right thigh tourniquet placed.  The  right lower extremity was prepped and draped in sterile fashion.  A time-   out was performed identifying the patient, planned procedure, and   extremity.      The right lower extremity was placed in the [facility] leg holder.  The leg was   exsanguinated, tourniquet elevated to 250 mmHg.  A midline incision was   made followed by median parapatellar arthrotomy.  Following initial   exposure, attention was first directed to the patella.  Precut   measurement was noted to be 25 mm.  I resected down to 15 mm and used a   38 anatomic  patellar button to restore patellar height as well as cover the cut   surface.      The lug holes were drilled and a metal shim was placed to protect the   patella from retractors and saw blades.      At this point, attention was now directed to the femur.  The femoral   canal was opened with a drill, irrigated to try to prevent fat emboli.  An   intramedullary rod was passed at 5 degrees valgus, 7 mm of bone was   resected off the distal femur.  Following this resection, the tibia was   subluxated anteriorly.  Using the extramedullary guide, 2 mm of bone was resected off   the proximal medial tibia.  We confirmed the gap would be   stable medially and laterally with a size 6 spacer block as well as confirmed   the cut was perpendicular in the coronal plane, checking with an alignment rod.      Once this was done, I sized the femur to be a size 6 in the anterior-   posterior dimension, chose a standard component based on medial and   lateral dimension.  The size 6 rotation block was then pinned in   position anterior referenced using the C-clamp to set rotation.  The   anterior, posterior, and  chamfer cuts were made without difficulty nor   notching making certain that I was along the anterior cortex to help   with flexion gap stability.      The final box cut was made off the lateral aspect of distal femur.      At this point, the tibia was sized to be a size 6, the size 6 tray was   then pinned in position through the medial third of the tubercle,   drilled, and keel punched.  Trial reduction was now carried with a 6 femur,  6 tibia, a size 6 then 7 mm PS insert,  and the 38 anatomic patella botton.  The knee was brought to   extension, full extension with good flexion stability with the patella   tracking through the trochlea without application of pressure.  Given   all these findings, the trial components removed.  Final components were   opened and cement was mixed.  The knee  was irrigated with normal saline   solution and pulse lavage.  The synovial lining was   then injected with the remaining 30cc of the combination 30 cc of 0.25% Marcaine with epinephrine and 1 cc of Toradol plus 30 cc of NS for a total of 61 cc.      The knee was irrigated.  Final implants were then cemented onto clean and   dried cut surfaces of bone with the knee brought to extension with a size 7 mm PS trial insert.      Once the cement had fully cured, the excess cement was removed   throughout the knee.  I confirmed I was satisfied with the range of   motion and stability, and the final size 7 mm PS AOX insert was chosen.  It was   placed into the knee.      The tourniquet had been let down at 36 minutes.  No significant   hemostasis required.  The   extensor mechanism was then reapproximated using #1 Vicryl and #0 Stratafix sutures with the knee   in flexion.  The   remaining wound was closed with 2-0 Vicryl and running 4-0 Monocryl.   The knee was cleaned, dried, dressed sterilely using Dermabond and   Aquacel dressing.  The patient was then   brought to recovery room in stable condition, tolerating the procedure   well.   Please note that Physician Assistant, Lanney GinsMatthew Babish, PA-C, was present for the entirety of the case, and was utilized for pre-operative positioning, peri-operative retractor management, general facilitation of the procedure.  He was also utilized for primary wound closure at the end of the case.              Madlyn FrankelMatthew D. Charlann Boxerlin, M.D.    06/02/2017 3:22 PM

## 2017-06-02 NOTE — Progress Notes (Signed)
AssistedDr. Renold DonGermeroth with right then left, ultrasound guided, adductor canal blocks. Side rails up, monitors on throughout procedure. See vital signs in flow sheet. Tolerated Procedure well.

## 2017-06-02 NOTE — Anesthesia Procedure Notes (Signed)
Spinal  Patient location during procedure: OR End time: 06/02/2017 12:50 PM Staffing Resident/CRNA: Lissa Morales, CRNA Performed: anesthesiologist and resident/CRNA  Preanesthetic Checklist Completed: patient identified, site marked, surgical consent, pre-op evaluation, timeout performed, IV checked, risks and benefits discussed and monitors and equipment checked Spinal Block Patient position: sitting Prep: ChloraPrep Patient monitoring: continuous pulse ox and blood pressure Approach: midline Location: L3-4 Injection technique: single-shot Needle Needle type: Pencan  Needle gauge: 24 G Needle length: 9 cm Assessment Sensory level: T4 Additional Notes Expiration date of kit checked and confirmed. Patient tolerated procedure well, without complications.

## 2017-06-02 NOTE — Anesthesia Preprocedure Evaluation (Signed)
Anesthesia Evaluation  Patient identified by MRN, date of birth, ID band Patient awake    Reviewed: Allergy & Precautions, NPO status , Patient's Chart, lab work & pertinent test results  Airway Mallampati: II  TM Distance: >3 FB Neck ROM: Full    Dental no notable dental hx.    Pulmonary sleep apnea ,    Pulmonary exam normal breath sounds clear to auscultation       Cardiovascular negative cardio ROS Normal cardiovascular exam Rhythm:Regular Rate:Normal     Neuro/Psych negative neurological ROS  negative psych ROS   GI/Hepatic negative GI ROS, Neg liver ROS,   Endo/Other  negative endocrine ROS  Renal/GU negative Renal ROS     Musculoskeletal  (+) Arthritis ,   Abdominal   Peds  Hematology negative hematology ROS (+)   Anesthesia Other Findings   Reproductive/Obstetrics negative OB ROS                             Anesthesia Physical Anesthesia Plan  ASA: II  Anesthesia Plan: Spinal   Post-op Pain Management:  Regional for Post-op pain   Induction: Intravenous  PONV Risk Score and Plan: 2 and Ondansetron and Propofol infusion  Airway Management Planned: Natural Airway  Additional Equipment:   Intra-op Plan:   Post-operative Plan:   Informed Consent: I have reviewed the patients History and Physical, chart, labs and discussed the procedure including the risks, benefits and alternatives for the proposed anesthesia with the patient or authorized representative who has indicated his/her understanding and acceptance.   Dental advisory given  Plan Discussed with: CRNA  Anesthesia Plan Comments:         Anesthesia Quick Evaluation

## 2017-06-03 ENCOUNTER — Encounter (HOSPITAL_COMMUNITY): Payer: Self-pay | Admitting: Orthopedic Surgery

## 2017-06-03 DIAGNOSIS — E669 Obesity, unspecified: Secondary | ICD-10-CM | POA: Diagnosis present

## 2017-06-03 DIAGNOSIS — G4730 Sleep apnea, unspecified: Secondary | ICD-10-CM | POA: Diagnosis present

## 2017-06-03 DIAGNOSIS — R110 Nausea: Secondary | ICD-10-CM | POA: Diagnosis not present

## 2017-06-03 DIAGNOSIS — M659 Synovitis and tenosynovitis, unspecified: Secondary | ICD-10-CM | POA: Diagnosis present

## 2017-06-03 DIAGNOSIS — Z96653 Presence of artificial knee joint, bilateral: Secondary | ICD-10-CM

## 2017-06-03 DIAGNOSIS — K5900 Constipation, unspecified: Secondary | ICD-10-CM | POA: Diagnosis not present

## 2017-06-03 DIAGNOSIS — Z6834 Body mass index (BMI) 34.0-34.9, adult: Secondary | ICD-10-CM | POA: Diagnosis not present

## 2017-06-03 DIAGNOSIS — I951 Orthostatic hypotension: Secondary | ICD-10-CM | POA: Diagnosis not present

## 2017-06-03 DIAGNOSIS — M170 Bilateral primary osteoarthritis of knee: Secondary | ICD-10-CM | POA: Diagnosis present

## 2017-06-03 LAB — CBC
HCT: 36.1 % — ABNORMAL LOW (ref 39.0–52.0)
Hemoglobin: 12.1 g/dL — ABNORMAL LOW (ref 13.0–17.0)
MCH: 30 pg (ref 26.0–34.0)
MCHC: 33.5 g/dL (ref 30.0–36.0)
MCV: 89.4 fL (ref 78.0–100.0)
PLATELETS: 176 10*3/uL (ref 150–400)
RBC: 4.04 MIL/uL — ABNORMAL LOW (ref 4.22–5.81)
RDW: 12.2 % (ref 11.5–15.5)
WBC: 13.2 10*3/uL — AB (ref 4.0–10.5)

## 2017-06-03 LAB — BASIC METABOLIC PANEL
ANION GAP: 8 (ref 5–15)
BUN: 23 mg/dL — ABNORMAL HIGH (ref 6–20)
CALCIUM: 8.2 mg/dL — AB (ref 8.9–10.3)
CO2: 25 mmol/L (ref 22–32)
CREATININE: 1.02 mg/dL (ref 0.61–1.24)
Chloride: 104 mmol/L (ref 101–111)
Glucose, Bld: 156 mg/dL — ABNORMAL HIGH (ref 65–99)
Potassium: 5.1 mmol/L (ref 3.5–5.1)
SODIUM: 137 mmol/L (ref 135–145)

## 2017-06-03 MED ORDER — FERROUS SULFATE 325 (65 FE) MG PO TABS: 325.0000 mg | ORAL_TABLET | Freq: Three times a day (TID) | ORAL | 3 refills | Status: DC

## 2017-06-03 MED ORDER — ASPIRIN 81 MG PO CHEW: 81.0000 mg | CHEWABLE_TABLET | Freq: Two times a day (BID) | ORAL | 0 refills | Status: AC

## 2017-06-03 MED ORDER — DOCUSATE SODIUM 100 MG PO CAPS: 100.0000 mg | ORAL_CAPSULE | Freq: Two times a day (BID) | ORAL | 0 refills | Status: DC

## 2017-06-03 MED ORDER — POLYETHYLENE GLYCOL 3350 17 G PO PACK: 17.0000 g | PACK | Freq: Two times a day (BID) | ORAL | 0 refills | Status: DC

## 2017-06-03 MED ORDER — OXYCODONE HCL 5 MG PO TABS: 5.0000 mg | ORAL_TABLET | ORAL | 0 refills | Status: DC | PRN

## 2017-06-03 MED ORDER — METHOCARBAMOL 500 MG PO TABS: 500.0000 mg | ORAL_TABLET | Freq: Four times a day (QID) | ORAL | 0 refills | Status: DC | PRN

## 2017-06-03 MED ORDER — RIVAROXABAN 10 MG PO TABS: 10.0000 mg | ORAL_TABLET | Freq: Every day | ORAL | 0 refills | Status: DC

## 2017-06-03 MED ORDER — ACETAMINOPHEN 500 MG PO TABS: 1000.0000 mg | ORAL_TABLET | Freq: Three times a day (TID) | ORAL | 2 refills | Status: DC | PRN

## 2017-06-03 NOTE — Progress Notes (Signed)
Physical Therapy Treatment Patient Details Name: James RollerDavid D Burch MRN: [id number] DOB: 09-Sep-1955 Today's Date: 06/03/2017    History of Present Illness Pt s/p bil TKR    PT Comments    Pt very motivated and with decreased nausea allowing initial ambulation of 18'   Follow Up Recommendations  Home health PT     Equipment Recommendations  None recommended by PT    Recommendations for Other Services OT consult     Precautions / Restrictions Precautions Precautions: Fall;Knee Restrictions Weight Bearing Restrictions: No RLE Weight Bearing: Weight bearing as tolerated LLE Weight Bearing: Weight bearing as tolerated    Mobility  Bed Mobility Overal bed mobility: Needs Assistance Bed Mobility: Supine to Sit     Supine to sit: Min assist     General bed mobility comments: cues for sequence; assist to manage LEs and to bring trunk to upright  Transfers Overall transfer level: Needs assistance Equipment used: Rolling walker (2 wheeled) Transfers: Sit to/from Stand Sit to Stand: Min assist;+2 physical assistance;From elevated surface;+2 safety/equipment         General transfer comment: cues for LE management and use of UEs to self assist  Ambulation/Gait Ambulation/Gait assistance: Min assist;+2 physical assistance;+2 safety/equipment Ambulation Distance (Feet): 17 Feet Assistive device: Rolling walker (2 wheeled) Gait Pattern/deviations: Decreased step length - right;Decreased step length - left;Step-to pattern;Shuffle;Trunk flexed Gait velocity: decr Gait velocity interpretation: Below normal speed for age/gender General Gait Details: cues for sequence, posture and position from Rohm and HaasW   Stairs            Wheelchair Mobility    Modified Rankin (Stroke Patients Only)       Balance                                            Cognition Arousal/Alertness: Awake/alert Behavior During Therapy: WFL for tasks assessed/performed Overall  Cognitive Status: Within Functional Limits for tasks assessed                                        Exercises Total Joint Exercises Ankle Circles/Pumps: AROM;Both;Supine;20 reps Quad Sets: AROM;Both;10 reps;Supine Heel Slides: AAROM;Both;10 reps;Supine Straight Leg Raises: AAROM;AROM;Both;10 reps;Supine    General Comments        Pertinent Vitals/Pain Pain Assessment: 0-10 Pain Score: 5  Pain Location: bil knees Pain Descriptors / Indicators: Aching;Sore Pain Intervention(s): Limited activity within patient's tolerance;Monitored during session;Premedicated before session;Ice applied    Home Living Family/patient expects to be discharged to:: Private residence Living Arrangements: Spouse/significant other;Parent Available Help at Discharge: Family Type of Home: House Home Access: Stairs to enter Entrance Stairs-Rails: Right;Left Home Layout: One level Home Equipment: Environmental consultantWalker - 2 wheels;Bedside commode      Prior Function Level of Independence: Independent          PT Goals (current goals can now be found in the care plan section) Acute Rehab PT Goals Patient Stated Goal: Regain IND PT Goal Formulation: With patient Time For Goal Achievement: 06/10/17 Potential to Achieve Goals: Good Progress towards PT goals: Progressing toward goals    Frequency    7X/week      PT Plan Current plan remains appropriate    Co-evaluation              AM-PAC PT "6  Clicks" Daily Activity  Outcome Measure  Difficulty turning over in bed (including adjusting bedclothes, sheets and blankets)?: Unable Difficulty moving from lying on back to sitting on the side of the bed? : Unable Difficulty sitting down on and standing up from a chair with arms (e.g., wheelchair, bedside commode, etc,.)?: Unable Help needed moving to and from a bed to chair (including a wheelchair)?: A Lot Help needed walking in hospital room?: A Lot Help needed climbing 3-5 steps with a  railing? : Total 6 Click Score: 8    End of Session Equipment Utilized During Treatment: Gait belt Activity Tolerance: Patient tolerated treatment well Patient left: in chair;with call bell/phone within reach;with family/visitor present Nurse Communication: Mobility status PT Visit Diagnosis: Unsteadiness on feet (R26.81);Difficulty in walking, not elsewhere classified (R26.2)     Time: 1131-1145 PT Time Calculation (min) (ACUTE ONLY): 14 min  Charges:  $Gait Training: 8-22 mins $Therapeutic Exercise: 8-22 mins                    G Codes:  Functional Assessment Tool Used: AM-PAC 6 Clicks Basic Mobility Functional Limitation: Mobility: Walking and moving around Mobility: Walking and Moving Around Current Status ([id number](G8978): At least 80 percent but less than 100 percent impaired, limited or restricted Mobility: Walking and Moving Around Goal Status [phone]([id number]): At least 20 percent but less than 40 percent impaired, limited or restricted    Pg [phone]    Tyniah Kastens 06/03/2017, 12:43 PM

## 2017-06-03 NOTE — Plan of Care (Signed)
  No Outcome Acute Rehab PT Goals(only PT should resolve) Pt Will Go Supine/Side To Sit 06/03/2017 1032 by Brien FewBradshaw, Jennye Runquist P, PT Flowsheets Taken 06/03/2017 1032  Pt will go Supine/Side to Sit with supervision Pt Will Go Sit To Supine/Side 06/03/2017 1032 by Brien FewBradshaw, Lin Glazier P, PT Flowsheets Taken 06/03/2017 1032  Pt will go Sit to Supine/Side with supervision Patient Will Transfer Sit To/From Stand 06/03/2017 1032 by Brien FewBradshaw, Majd Tissue P, PT Flowsheets Taken 06/03/2017 1032  Patient will transfer sit to/from stand with supervision Pt Will Ambulate 06/03/2017 1032 by Brien FewBradshaw, Vaniyah Lansky P, PT Flowsheets Taken 06/03/2017 1032  Pt will Ambulate 100 feet;with supervision;with rolling walker Pt Will Go Up/Down Stairs 06/03/2017 1032 by Brien FewBradshaw, Willies Laviolette P, PT Flowsheets Taken 06/03/2017 1032  Pt will Go Up / Down Stairs 3-5 stairs;with minimal assist;with least restrictive assistive device   No Outcome Acute Rehab PT Goals(only PT should resolve) Pt Will Go Supine/Side To Sit 06/03/2017 1032 by Brien FewBradshaw, Caramia Boutin P, PT Flowsheets Taken 06/03/2017 1032  Pt will go Supine/Side to Sit with supervision Pt Will Go Sit To Supine/Side 06/03/2017 1032 by Brien FewBradshaw, Sofya Moustafa P, PT Flowsheets Taken 06/03/2017 1032  Pt will go Sit to Supine/Side with supervision Patient Will Transfer Sit To/From Stand 06/03/2017 1032 by Brien FewBradshaw, Tiffannie Sloss P, PT Flowsheets Taken 06/03/2017 1032  Patient will transfer sit to/from stand with supervision Pt Will Ambulate 06/03/2017 1032 by Brien FewBradshaw, Stephaney Steven P, PT Flowsheets Taken 06/03/2017 1032  Pt will Ambulate 100 feet;with supervision;with rolling walker Pt Will Go Up/Down Stairs 06/03/2017 1032 by Brien FewBradshaw, Katriel Cutsforth P, PT Flowsheets Taken 06/03/2017 1032  Pt will Go Up / Down Stairs 3-5 stairs;with minimal assist;with least restrictive assistive device

## 2017-06-03 NOTE — Addendum Note (Signed)
Addendum  created 06/03/17 0616 by Elyn PeersAllen, Tobyn Osgood J, CRNA   Charge Capture section accepted

## 2017-06-03 NOTE — Progress Notes (Signed)
UnitedHealthMatt Babbish PA notified of patient's urine output. Rec'd order to cont IVF and monitor.

## 2017-06-03 NOTE — Progress Notes (Signed)
     Subjective: 1 Day Post-Op Procedure(s) (LRB): TOTAL KNEE BILATERAL (Bilateral)   Seen by Dr. Charlann Boxerlin. Patient reports pain as mild, pain controlled. No events throughout the night.  Complaining of nausea this morning, trying to eat some crackers.  Objective:   VITALS:   Vitals:   06/03/17 0200 06/03/17 0631  BP: 100/76 (!) 99/57  Pulse: 76 69  Resp: 14 13  Temp: 98 F (36.7 C) 98.1 F (36.7 C)  SpO2: 99% 96%   Bilaterally: Dorsiflexion/Plantar flexion intact Incision: dressing C/D/I No cellulitis present Compartment soft  LABS Recent Labs    06/03/17 0534  HGB 12.1*  HCT 36.1*  WBC 13.2*  PLT 176    Recent Labs    06/03/17 0534  NA 137  K 5.1  BUN 23*  CREATININE 1.02  GLUCOSE 156*     Assessment/Plan: 1 Day Post-Op Procedure(s) (LRB): TOTAL KNEE BILATERAL (Bilateral) Foley cath d/c'ed Advance diet Up with therapy D/C IV fluids Discharge home with home health , eventually when ready  Obese (BMI 30-39.9) Estimated body mass index is 34.31 kg/m as calculated from the following:   Height as of this encounter: 5\' 11"  (1.803 m).   Weight as of this encounter: 111.6 kg (246 lb). Patient also counseled that weight may inhibit the healing process Patient counseled that losing weight will help with future health issues       Anastasio AuerbachMatthew S. Kaytie Ratcliffe   PAC  06/03/2017, 7:46 AM

## 2017-06-03 NOTE — Evaluation (Signed)
Physical Therapy Evaluation Patient Details Name: James RollerDavid D Weitman MRN: [id number] DOB: [birth year] Today's Date: 06/03/2017   History of Present Illness  Pt s/p bil TKR  Clinical Impression  Pt s/p bil TKR and presents with decreased bil LE strength/ROM and post op pain limiting functional mobility.  Pt hopes to progress to dc home with family assist and HHPT follow up.    Follow Up Recommendations Home health PT    Equipment Recommendations  None recommended by PT    Recommendations for Other Services OT consult     Precautions / Restrictions Precautions Precautions: Fall;Knee Restrictions Weight Bearing Restrictions: No RLE Weight Bearing: Weight bearing as tolerated LLE Weight Bearing: Weight bearing as tolerated      Mobility  Bed Mobility                  Transfers                 General transfer comment: NT - eval ltd by N&V  Ambulation/Gait                Stairs            Wheelchair Mobility    Modified Rankin (Stroke Patients Only)       Balance                                             Pertinent Vitals/Pain Pain Assessment: 0-10 Pain Score: 5  Pain Location: bil knees Pain Descriptors / Indicators: Aching;Sore Pain Intervention(s): Limited activity within patient's tolerance;Monitored during session;Premedicated before session;Ice applied    Home Living Family/patient expects to be discharged to:: Private residence Living Arrangements: Spouse/significant other;Parent Available Help at Discharge: Family Type of Home: House Home Access: Stairs to enter Entrance Stairs-Rails: Doctor, general practiceight;Left Entrance Stairs-Number of Steps: 3 Home Layout: One level Home Equipment: Environmental consultantWalker - 2 wheels;Bedside commode      Prior Function Level of Independence: Independent               Hand Dominance        Extremity/Trunk Assessment   Upper Extremity Assessment Upper Extremity Assessment: Overall WFL for  tasks assessed    Lower Extremity Assessment Lower Extremity Assessment: RLE deficits/detail;LLE deficits/detail RLE Deficits / Details: 3-/5 quads with AAROM at knee -10 - 65 LLE Deficits / Details: 3/5 quads with AAROM at knee -10 - 65    Cervical / Trunk Assessment Cervical / Trunk Assessment: Normal  Communication   Communication: No difficulties  Cognition Arousal/Alertness: Awake/alert Behavior During Therapy: WFL for tasks assessed/performed Overall Cognitive Status: Within Functional Limits for tasks assessed                                        General Comments      Exercises Total Joint Exercises Ankle Circles/Pumps: AROM;Both;Supine;20 reps Quad Sets: AROM;Both;10 reps;Supine Heel Slides: AAROM;Both;10 reps;Supine Straight Leg Raises: AAROM;AROM;Both;10 reps;Supine   Assessment/Plan    PT Assessment Patient needs continued PT services  PT Problem List Decreased strength;Decreased range of motion;Decreased activity tolerance;Decreased mobility;Decreased knowledge of use of DME;Pain       PT Treatment Interventions DME instruction;Gait training;Stair training;Functional mobility training;Therapeutic activities;Therapeutic exercise;Patient/family education    PT Goals (Current goals can be found in the Care Plan section)  Acute Rehab PT Goals Patient Stated Goal: Regain IND PT Goal Formulation: With patient Time For Goal Achievement: 06/10/17 Potential to Achieve Goals: Good    Frequency 7X/week   Barriers to discharge        Co-evaluation               AM-PAC PT "6 Clicks" Daily Activity  Outcome Measure Difficulty turning over in bed (including adjusting bedclothes, sheets and blankets)?: Unable Difficulty moving from lying on back to sitting on the side of the bed? : Unable Difficulty sitting down on and standing up from a chair with arms (e.g., wheelchair, bedside commode, etc,.)?: Unable Help needed moving to and from a  bed to chair (including a wheelchair)?: A Lot Help needed walking in hospital room?: A Lot Help needed climbing 3-5 steps with a railing? : Total 6 Click Score: 8    End of Session   Activity Tolerance: Other (comment)(N&V) Patient left: in bed;with call bell/phone within reach;with family/visitor present Nurse Communication: Mobility status PT Visit Diagnosis: Unsteadiness on feet (R26.81);Difficulty in walking, not elsewhere classified (R26.2)    Time: [id number] PT Time Calculation (min) (ACUTE ONLY): 28 min   Charges:   PT Evaluation $PT Eval Low Complexity: 1 Low PT Treatments $Therapeutic Exercise: 8-22 mins   PT G Codes:   PT G-Codes **NOT FOR INPATIENT CLASS** Functional Assessment Tool Used: AM-PAC 6 Clicks Basic Mobility Functional Limitation: Mobility: Walking and moving around Mobility: Walking and Moving Around Current Status ([id number]([id number]): At least 80 percent but less than 100 percent impaired, limited or restricted Mobility: Walking and Moving Around Goal Status [phone]([id number]): At least 20 percent but less than 40 percent impaired, limited or restricted    Pg [phone]   Siearra Amberg 06/03/2017, 10:29 AM

## 2017-06-03 NOTE — Progress Notes (Signed)
Discharge planning, spoke with patient and spouse at bedside. Have chosen [facility] for HH PT. Contacted [facility] for referral. Needs RW, contacted [facility] to deliver to room. [phone] 

## 2017-06-03 NOTE — Progress Notes (Signed)
Physical Therapy Treatment Patient Details Name: Carmelia RollerDavid D Nestle MRN: [id number] DOB: [birth year] Today's Date: 06/03/2017    History of Present Illness Pt s/p bil TKR    PT Comments    Pt very motivated and progressing well with mobility.  Follow Up Recommendations  Home health PT     Equipment Recommendations  None recommended by PT    Recommendations for Other Services OT consult     Precautions / Restrictions Precautions Precautions: Fall;Knee Restrictions Weight Bearing Restrictions: No RLE Weight Bearing: Weight bearing as tolerated LLE Weight Bearing: Weight bearing as tolerated    Mobility  Bed Mobility Overal bed mobility: Needs Assistance Bed Mobility: Sit to Supine       Sit to supine: Min assist   General bed mobility comments: cues for sequence; assist to manage LEs   Transfers Overall transfer level: Needs assistance Equipment used: Rolling walker (2 wheeled) Transfers: Sit to/from Stand Sit to Stand: Min assist;+2 physical assistance;From elevated surface;+2 safety/equipment         General transfer comment: cues for LE management and use of UEs to self assist  Ambulation/Gait Ambulation/Gait assistance: Min assist;+2 safety/equipment Ambulation Distance (Feet): 45 Feet Assistive device: Rolling walker (2 wheeled) Gait Pattern/deviations: Decreased step length - right;Decreased step length - left;Step-to pattern;Shuffle;Trunk flexed Gait velocity: decr Gait velocity interpretation: Below normal speed for age/gender General Gait Details: cues for sequence, posture and position from Rohm and HaasW   Stairs            Wheelchair Mobility    Modified Rankin (Stroke Patients Only)       Balance                                            Cognition Arousal/Alertness: Awake/alert Behavior During Therapy: WFL for tasks assessed/performed Overall Cognitive Status: Within Functional Limits for tasks assessed                                         Exercises      General Comments        Pertinent Vitals/Pain Pain Assessment: 0-10 Pain Score: 5  Pain Location: bil knees Pain Descriptors / Indicators: Aching;Sore Pain Intervention(s): Limited activity within patient's tolerance;Monitored during session;Premedicated before session;Ice applied    Home Living                      Prior Function            PT Goals (current goals can now be found in the care plan section) Acute Rehab PT Goals Patient Stated Goal: Regain IND PT Goal Formulation: With patient Time For Goal Achievement: 06/10/17 Potential to Achieve Goals: Good Progress towards PT goals: Progressing toward goals    Frequency    7X/week      PT Plan Current plan remains appropriate    Co-evaluation              AM-PAC PT "6 Clicks" Daily Activity  Outcome Measure  Difficulty turning over in bed (including adjusting bedclothes, sheets and blankets)?: Unable Difficulty moving from lying on back to sitting on the side of the bed? : Unable Difficulty sitting down on and standing up from a chair with arms (e.g., wheelchair, bedside commode, etc,.)?: Unable Help needed moving  to and from a bed to chair (including a wheelchair)?: A Lot Help needed walking in hospital room?: A Lot Help needed climbing 3-5 steps with a railing? : Total 6 Click Score: 8    End of Session Equipment Utilized During Treatment: Gait belt Activity Tolerance: Patient tolerated treatment well Patient left: in bed;with call bell/phone within reach;with family/visitor present Nurse Communication: Mobility status PT Visit Diagnosis: Unsteadiness on feet (R26.81);Difficulty in walking, not elsewhere classified (R26.2)     Time: [id number] PT Time Calculation (min) (ACUTE ONLY): 15 min  Charges:  $Gait Training: 8-22 mins                    G Codes:       Pg [phone]    Abdulhamid Olgin 06/03/2017, 4:05 PM

## 2017-06-04 LAB — BASIC METABOLIC PANEL
ANION GAP: 5 (ref 5–15)
BUN: 21 mg/dL — ABNORMAL HIGH (ref 6–20)
CALCIUM: 8.2 mg/dL — AB (ref 8.9–10.3)
CO2: 27 mmol/L (ref 22–32)
CREATININE: 0.9 mg/dL (ref 0.61–1.24)
Chloride: 107 mmol/L (ref 101–111)
Glucose, Bld: 129 mg/dL — ABNORMAL HIGH (ref 65–99)
Potassium: 4.5 mmol/L (ref 3.5–5.1)
SODIUM: 139 mmol/L (ref 135–145)

## 2017-06-04 LAB — CBC
HCT: 31.8 % — ABNORMAL LOW (ref 39.0–52.0)
Hemoglobin: 10.9 g/dL — ABNORMAL LOW (ref 13.0–17.0)
MCH: 30.4 pg (ref 26.0–34.0)
MCHC: 34.3 g/dL (ref 30.0–36.0)
MCV: 88.8 fL (ref 78.0–100.0)
PLATELETS: 161 10*3/uL (ref 150–400)
RBC: 3.58 MIL/uL — ABNORMAL LOW (ref 4.22–5.81)
RDW: 12.4 % (ref 11.5–15.5)
WBC: 12.6 10*3/uL — AB (ref 4.0–10.5)

## 2017-06-04 MED ORDER — HYDROCODONE-ACETAMINOPHEN 7.5-325 MG PO TABS
1.0000 | ORAL_TABLET | ORAL | Status: DC | PRN
  Administered 2017-06-04 (×2): 2 via ORAL
  Administered 2017-06-04 (×2): 1 via ORAL
  Administered 2017-06-05 (×2): 2 via ORAL
  Administered 2017-06-05: 1 via ORAL
  Filled 2017-06-04: qty 2
  Filled 2017-06-04: qty 1
  Filled 2017-06-04 (×3): qty 2
  Filled 2017-06-04 (×2): qty 1

## 2017-06-04 MED ORDER — FUROSEMIDE 10 MG/ML IJ SOLN
10.0000 mg | Freq: Once | INTRAMUSCULAR | Status: AC
  Administered 2017-06-04: 08:00:00 10 mg via INTRAVENOUS
  Filled 2017-06-04: qty 2

## 2017-06-04 NOTE — Progress Notes (Signed)
Patient ID: James RollerDavid D Romberger, male   DOB: 02/26/1956, 61 y.o.   MRN: [id number] Subjective: 2 Days Post-Op Procedure(s) (LRB): TOTAL KNEE BILATERAL (Bilateral)    Patient reports pain as mild to moderate.  Nausea better at this point.  Still keeping an eye on I&Os  Objective:   VITALS:   Vitals:   06/03/17 2110 06/04/17 0344  BP: 137/67 125/65  Pulse: 75 78  Resp: 18 18  Temp: 98 F (36.7 C) 98.1 F (36.7 C)  SpO2: 97% 98%    Neurovascular intact Incision: dressing C/D/I  LABS Recent Labs    06/03/17 0534 06/04/17 0536  HGB 12.1* 10.9*  HCT 36.1* 31.8*  WBC 13.2* 12.6*  PLT 176 161    Recent Labs    06/03/17 0534 06/04/17 0536  NA 137 139  K 5.1 4.5  BUN 23* 21*  CREATININE 1.02 0.90  GLUCOSE 156* 129*    No results for input(s): LABPT, INR in the last 72 hours.   Assessment/Plan: 2 Days Post-Op Procedure(s) (LRB): TOTAL KNEE BILATERAL (Bilateral)   Up with therapy Plan for discharge tomorrow versus tomorrow  Change pain meds to try and reduce nausea/constipation concerns Lasix once today, maintain foley until noonish then D/C Up with therapy Discharge pending therapy, I&Os SL IVF

## 2017-06-04 NOTE — Progress Notes (Signed)
Physical Therapy Treatment Patient Details Name: James RollerDavid D Burch MRN: [id number] DOB: 19-Dec-1955 Today's Date: 06/04/2017    History of Present Illness Pt s/p bil TKR    PT Comments    Therex program performed.   Follow Up Recommendations  Home health PT     Equipment Recommendations  None recommended by PT    Recommendations for Other Services OT consult     Precautions / Restrictions Precautions Precautions: Fall;Knee Restrictions Weight Bearing Restrictions: No RLE Weight Bearing: Weight bearing as tolerated LLE Weight Bearing: Weight bearing as tolerated    Mobility  Bed Mobility Overal bed mobility: Needs Assistance Bed Mobility: Sit to Supine       Sit to supine: Min assist   General bed mobility comments: cues for sequence; assist to manage LEs   Transfers Overall transfer level: Needs assistance Equipment used: Rolling walker (2 wheeled) Transfers: Sit to/from Stand Sit to Stand: Min assist;+2 physical assistance;From elevated surface;+2 safety/equipment Stand pivot transfers: Min assist;+2 physical assistance;+2 safety/equipment;From elevated surface       General transfer comment: cues for LE management and use of UEs to self assist  Ambulation/Gait Ambulation/Gait assistance: Min assist;+2 safety/equipment Ambulation Distance (Feet): 40 Feet Assistive device: Rolling walker (2 wheeled) Gait Pattern/deviations: Decreased step length - right;Decreased step length - left;Step-to pattern;Shuffle;Trunk flexed Gait velocity: decr   General Gait Details: cues for sequence, posture and position from RW.  Distance ltd by onset dizziness - BP 77/46 - RN aware   Stairs            Wheelchair Mobility    Modified Rankin (Stroke Patients Only)       Balance                                            Cognition Arousal/Alertness: Awake/alert Behavior During Therapy: WFL for tasks assessed/performed Overall Cognitive  Status: Within Functional Limits for tasks assessed                                        Exercises Total Joint Exercises Ankle Circles/Pumps: AROM;Both;Supine;20 reps Quad Sets: AROM;Both;10 reps;Supine Heel Slides: AAROM;Both;10 reps;Supine Straight Leg Raises: AAROM;AROM;Both;10 reps;Supine Goniometric ROM: AAROM R knee -10- 75; L knee -10 - 70    General Comments        Pertinent Vitals/Pain Pain Assessment: 0-10 Pain Score: 5  Pain Location: bil knees Pain Descriptors / Indicators: Aching;Sore Pain Intervention(s): Limited activity within patient's tolerance;Monitored during session;Premedicated before session;Ice applied    Home Living Family/patient expects to be discharged to:: Private residence Living Arrangements: Spouse/significant other;Parent Available Help at Discharge: Family         Home Equipment: Dan HumphreysWalker - 2 wheels;Bedside commode      Prior Function Level of Independence: Independent          PT Goals (current goals can now be found in the care plan section) Acute Rehab PT Goals Patient Stated Goal: Regain IND PT Goal Formulation: With patient Time For Goal Achievement: 06/10/17 Potential to Achieve Goals: Good Progress towards PT goals: Progressing toward goals    Frequency    7X/week      PT Plan Current plan remains appropriate    Co-evaluation PT/OT/SLP Co-Evaluation/Treatment: Yes Reason for Co-Treatment: For patient/therapist safety PT goals addressed during session:  Mobility/safety with mobility OT goals addressed during session: ADL's and self-care      AM-PAC PT "6 Clicks" Daily Activity  Outcome Measure  Difficulty turning over in bed (including adjusting bedclothes, sheets and blankets)?: Unable Difficulty moving from lying on back to sitting on the side of the bed? : Unable Difficulty sitting down on and standing up from a chair with arms (e.g., wheelchair, bedside commode, etc,.)?: Unable Help needed  moving to and from a bed to chair (including a wheelchair)?: A Lot Help needed walking in hospital room?: A Lot Help needed climbing 3-5 steps with a railing? : Total 6 Click Score: 8    End of Session Equipment Utilized During Treatment: Gait belt Activity Tolerance: Other (comment)(orthostatic with BP 77/46) Patient left: in bed;with call bell/phone within reach;with family/visitor present Nurse Communication: Mobility status PT Visit Diagnosis: Unsteadiness on feet (R26.81);Difficulty in walking, not elsewhere classified (R26.2)     Time: [id number] PT Time Calculation (min) (ACUTE ONLY): 18 min  Charges:  $Gait Training: 8-22 mins $Therapeutic Exercise: 8-22 mins                    G Codes:       Pg [phone]    James Burch 06/04/2017, 5:16 PM

## 2017-06-04 NOTE — Progress Notes (Signed)
OT Cancellation Note  Patient Details Name: James RollerDavid D Burch MRN: [id number] DOB: [birth year]   Cancelled Treatment:    Reason Eval/Treat Not Completed: Other (comment)  Pt's BP low with PT this am. Will check back in the pm. .   Darsi Tien 06/04/2017, 11:33 AM  Marica OtterMaryellen Sofia Vanmeter, OTR/L [phone] 06/04/2017

## 2017-06-04 NOTE — Plan of Care (Signed)
  Pain Managment: General experience of comfort will improve 06/04/2017 1846 - Progressing by William Daltonarpenter, Ashtin Melichar L, RN   Safety: Ability to remain free from injury will improve 06/04/2017 1846 - Progressing by William Daltonarpenter, Lenford Beddow L, RN   Skin Integrity: Risk for impaired skin integrity will decrease 06/04/2017 1846 - Progressing by William Daltonarpenter, Quinnetta Roepke L, RN

## 2017-06-04 NOTE — Progress Notes (Signed)
Physical Therapy Treatment Patient Details Name: James RollerDavid D Kirk MRN: [id number] DOB: 03-31-1956 Today's Date: 06/04/2017    History of Present Illness Pt s/p bil TKR    PT Comments    Pt continues motivated but ltd this am by drop in BP with ambulation - 68/42 - RN aware.   Follow Up Recommendations  Home health PT     Equipment Recommendations  None recommended by PT    Recommendations for Other Services OT consult     Precautions / Restrictions Precautions Precautions: Fall;Knee Restrictions Weight Bearing Restrictions: No RLE Weight Bearing: Weight bearing as tolerated LLE Weight Bearing: Weight bearing as tolerated    Mobility  Bed Mobility Overal bed mobility: Needs Assistance Bed Mobility: Supine to Sit     Supine to sit: Min assist     General bed mobility comments: cues for sequence; assist to manage LEs   Transfers Overall transfer level: Needs assistance Equipment used: Rolling walker (2 wheeled) Transfers: Sit to/from Stand Sit to Stand: Min assist;+2 physical assistance;From elevated surface;+2 safety/equipment         General transfer comment: cues for LE management and use of UEs to self assist  Ambulation/Gait Ambulation/Gait assistance: Min assist;+2 safety/equipment Ambulation Distance (Feet): 50 Feet Assistive device: Rolling walker (2 wheeled) Gait Pattern/deviations: Decreased step length - right;Decreased step length - left;Step-to pattern;Shuffle;Trunk flexed Gait velocity: decr Gait velocity interpretation: Below normal speed for age/gender General Gait Details: cues for sequence, posture and position from RW.  Distance ltd by onset dizziness - BP 68/42   Stairs            Wheelchair Mobility    Modified Rankin (Stroke Patients Only)       Balance                                            Cognition Arousal/Alertness: Awake/alert Behavior During Therapy: WFL for tasks assessed/performed Overall  Cognitive Status: Within Functional Limits for tasks assessed                                        Exercises Total Joint Exercises Ankle Circles/Pumps: AROM;Both;Supine;20 reps Quad Sets: AROM;Both;10 reps;Supine Heel Slides: AAROM;Both;10 reps;Supine Straight Leg Raises: AAROM;AROM;Both;10 reps;Supine    General Comments        Pertinent Vitals/Pain Pain Assessment: 0-10 Pain Score: 5  Pain Location: bil knees Pain Descriptors / Indicators: Aching;Sore Pain Intervention(s): Limited activity within patient's tolerance;Monitored during session;Premedicated before session;Ice applied    Home Living                      Prior Function            PT Goals (current goals can now be found in the care plan section) Acute Rehab PT Goals Patient Stated Goal: Regain IND PT Goal Formulation: With patient Time For Goal Achievement: 06/10/17 Potential to Achieve Goals: Good Progress towards PT goals: Progressing toward goals    Frequency    7X/week      PT Plan Current plan remains appropriate    Co-evaluation              AM-PAC PT "6 Clicks" Daily Activity  Outcome Measure  Difficulty turning over in bed (including adjusting bedclothes, sheets and blankets)?:  Unable Difficulty moving from lying on back to sitting on the side of the bed? : Unable Difficulty sitting down on and standing up from a chair with arms (e.g., wheelchair, bedside commode, etc,.)?: Unable Help needed moving to and from a bed to chair (including a wheelchair)?: A Lot Help needed walking in hospital room?: A Lot Help needed climbing 3-5 steps with a railing? : Total 6 Click Score: 8    End of Session Equipment Utilized During Treatment: Gait belt Activity Tolerance: Other (comment)(orthostatic) Patient left: with call bell/phone within reach;with family/visitor present;in chair Nurse Communication: Mobility status PT Visit Diagnosis: Unsteadiness on feet  (R26.81);Difficulty in walking, not elsewhere classified (R26.2)     Time: [id number] PT Time Calculation (min) (ACUTE ONLY): 46 min  Charges:  $Gait Training: 8-22 mins $Therapeutic Exercise: 8-22 mins $Therapeutic Activity: 8-22 mins                    G Codes:       Pg [phone]    Danel Studzinski 06/04/2017, 12:30 PM

## 2017-06-04 NOTE — Evaluation (Signed)
Occupational Therapy Evaluation Patient Details Name: James RollerDavid D Burch MRN: [id number] DOB: 02-13-1956 Today's Date: 06/04/2017    History of Present Illness Pt s/p bil TKR   Clinical Impression   This 61 year old man was admitted for the above sxs.  He will benefit from continued OT in acute for further educate on bathroom transfers. Family will assist with adls    Follow Up Recommendations  Supervision/Assistance - 24 hour    Equipment Recommendations  None recommended by OT    Recommendations for Other Services       Precautions / Restrictions Precautions Precautions: Fall;Knee Restrictions Weight Bearing Restrictions: No RLE Weight Bearing: Weight bearing as tolerated LLE Weight Bearing: Weight bearing as tolerated      Mobility Bed Mobility Overal bed mobility: Needs Assistance Bed Mobility: Supine to Sit     Supine to sit: Min assist Sit to supine: Min assist   General bed mobility comments: cues for sequence; assist to manage LEs   Transfers Overall transfer level: Needs assistance Equipment used: Rolling walker (2 wheeled) Transfers: Sit to/from Stand Sit to Stand: Min assist;+2 physical assistance;From elevated surface;+2 safety/equipment         General transfer comment: cues for LE management and use of UEs to self assist    Balance                                           ADL either performed or assessed with clinical judgement   ADL Overall ADL's : Needs assistance/impaired Eating/Feeding: Independent   Grooming: Set up;Sitting   Upper Body Bathing: Set up;Sitting   Lower Body Bathing: Moderate assistance;Sit to/from stand   Upper Body Dressing : Set up;Sitting   Lower Body Dressing: Maximal assistance;+2 for safety/equipment;Sit to/from stand   Toilet Transfer: Minimal assistance;+2 for physical assistance;RW(chair)   Toileting- Clothing Manipulation and Hygiene: Minimal assistance;+2 for safety/equipment;Sit  to/from stand         General ADL Comments: pt's BP dropped when walking to 77/46.  when back in chair, 105/53.  Pt will have family assist with adls; not interested in AE     Vision         Perception     Praxis      Pertinent Vitals/Pain Pain Assessment: 0-10 Pain Score: 5  Pain Location: bil knees Pain Descriptors / Indicators: Aching;Sore Pain Intervention(s): Limited activity within patient's tolerance;Monitored during session;Premedicated before session;Repositioned     Hand Dominance     Extremity/Trunk Assessment Upper Extremity Assessment Upper Extremity Assessment: Overall WFL for tasks assessed           Communication Communication Communication: No difficulties   Cognition Arousal/Alertness: Awake/alert Behavior During Therapy: WFL for tasks assessed/performed Overall Cognitive Status: Within Functional Limits for tasks assessed                                     General Comments       Exercises    Shoulder Instructions      Home Living Family/patient expects to be discharged to:: Private residence Living Arrangements: Spouse/significant other;Parent Available Help at Discharge: Family               Bathroom Shower/Tub: Walk-in Human resources officershower   Bathroom Toilet: Standard     Home Equipment: Environmental consultantWalker - 2 wheels;Bedside  commode          Prior Functioning/Environment Level of Independence: Independent                 OT Problem List: Decreased strength;Decreased activity tolerance;Decreased knowledge of use of DME or AE;Pain      OT Treatment/Interventions: Self-care/ADL training;DME and/or AE instruction;Patient/family education;Balance training    OT Goals(Current goals can be found in the care plan section) Acute Rehab OT Goals Patient Stated Goal: Regain IND OT Goal Formulation: With patient Time For Goal Achievement: 06/11/17 Potential to Achieve Goals: Good ADL Goals Pt Will Transfer to Toilet: with min  assist;ambulating;bedside commode Pt Will Perform Tub/Shower Transfer: Shower transfer;with min assist;ambulating;3 in 1  OT Frequency: Min 2X/week   Barriers to D/C:            Co-evaluation PT/OT/SLP Co-Evaluation/Treatment: Yes Reason for Co-Treatment: For patient/therapist safety PT goals addressed during session: Mobility/safety with mobility OT goals addressed during session: ADL's and self-care      AM-PAC PT "6 Clicks" Daily Activity     Outcome Measure Help from another person eating meals?: None Help from another person taking care of personal grooming?: A Little Help from another person toileting, which includes using toliet, bedpan, or urinal?: A Little Help from another person bathing (including washing, rinsing, drying)?: A Lot Help from another person to put on and taking off regular upper body clothing?: A Little Help from another person to put on and taking off regular lower body clothing?: A Lot 6 Click Score: 17   End of Session    Activity Tolerance: Other (comment)(BP) Patient left: in bed;with call bell/phone within reach;with family/visitor present  OT Visit Diagnosis: Pain Pain - part of body: Knee(bil)                Time: 1610-96041400-1428 OT Time Calculation (min): 28 min Charges:  OT General Charges $OT Visit: 1 Visit OT Evaluation $OT Eval Low Complexity: 1 Low G-Codes:     ZeandaleMaryellen Izola Burch, OTR/L [phone] 06/04/2017  James Burch 06/04/2017, 3:20 PM

## 2017-06-04 NOTE — Progress Notes (Signed)
Physical Therapy Treatment Patient Details Name: James RollerDavid D Burch MRN: [id number] DOB: 1956/04/15 Today's Date: 06/04/2017    History of Present Illness Pt s/p bil TKR    PT Comments    Pt continues motivated but ltd by BP drop with attempts to mobilize.  Will return for therex.   Follow Up Recommendations  Home health PT     Equipment Recommendations  None recommended by PT    Recommendations for Other Services OT consult     Precautions / Restrictions Precautions Precautions: Fall;Knee Restrictions RLE Weight Bearing: Weight bearing as tolerated LLE Weight Bearing: Weight bearing as tolerated    Mobility  Bed Mobility Overal bed mobility: Needs Assistance Bed Mobility: Sit to Supine       Sit to supine: Min assist   General bed mobility comments: cues for sequence; assist to manage LEs   Transfers Overall transfer level: Needs assistance Equipment used: Rolling walker (2 wheeled) Transfers: Sit to/from Stand Sit to Stand: Min assist;+2 physical assistance;From elevated surface;+2 safety/equipment Stand pivot transfers: Min assist;+2 physical assistance;+2 safety/equipment;From elevated surface       General transfer comment: cues for LE management and use of UEs to self assist  Ambulation/Gait Ambulation/Gait assistance: Min assist;+2 safety/equipment Ambulation Distance (Feet): 40 Feet Assistive device: Rolling walker (2 wheeled) Gait Pattern/deviations: Decreased step length - right;Decreased step length - left;Step-to pattern;Shuffle;Trunk flexed Gait velocity: decr   General Gait Details: cues for sequence, posture and position from RW.  Distance ltd by onset dizziness - BP 77/46 - RN aware   Stairs            Wheelchair Mobility    Modified Rankin (Stroke Patients Only)       Balance                                            Cognition Arousal/Alertness: Awake/alert Behavior During Therapy: WFL for tasks  assessed/performed Overall Cognitive Status: Within Functional Limits for tasks assessed                                        Exercises      General Comments        Pertinent Vitals/Pain Pain Assessment: 0-10 Pain Score: 5  Pain Location: bil knees Pain Descriptors / Indicators: Aching;Sore Pain Intervention(s): Limited activity within patient's tolerance;Monitored during session;Premedicated before session;Ice applied    Home Living Family/patient expects to be discharged to:: Private residence Living Arrangements: Spouse/significant other;Parent Available Help at Discharge: Family         Home Equipment: Dan HumphreysWalker - 2 wheels;Bedside commode      Prior Function Level of Independence: Independent          PT Goals (current goals can now be found in the care plan section) Acute Rehab PT Goals Patient Stated Goal: Regain IND PT Goal Formulation: With patient Time For Goal Achievement: 06/10/17 Potential to Achieve Goals: Good Progress towards PT goals: Progressing toward goals    Frequency    7X/week      PT Plan Current plan remains appropriate    Co-evaluation PT/OT/SLP Co-Evaluation/Treatment: Yes Reason for Co-Treatment: For patient/therapist safety PT goals addressed during session: Mobility/safety with mobility OT goals addressed during session: ADL's and self-care      AM-PAC PT "6 Clicks"  Daily Activity  Outcome Measure  Difficulty turning over in bed (including adjusting bedclothes, sheets and blankets)?: Unable Difficulty moving from lying on back to sitting on the side of the bed? : Unable Difficulty sitting down on and standing up from a chair with arms (e.g., wheelchair, bedside commode, etc,.)?: Unable Help needed moving to and from a bed to chair (including a wheelchair)?: A Lot Help needed walking in hospital room?: A Lot Help needed climbing 3-5 steps with a railing? : Total 6 Click Score: 8    End of Session  Equipment Utilized During Treatment: Gait belt Activity Tolerance: Other (comment)(orthostatic with BP 77/46) Patient left: in bed;with call bell/phone within reach;with family/visitor present Nurse Communication: Mobility status PT Visit Diagnosis: Unsteadiness on feet (R26.81);Difficulty in walking, not elsewhere classified (R26.2)     Time: [id number] PT Time Calculation (min) (ACUTE ONLY): 18 min  Charges:  $Gait Training: 8-22 mins                    G Codes:      Pg [phone]    James Burch 06/04/2017, 5:13 PM

## 2017-06-05 MED ORDER — HYDROCODONE-ACETAMINOPHEN 7.5-325 MG PO TABS: 1.0000 | ORAL_TABLET | ORAL | 0 refills | Status: DC | PRN

## 2017-06-05 MED ORDER — SODIUM CHLORIDE 0.9 % IV BOLUS (SEPSIS): 500.0000 mL | Freq: Once | INTRAVENOUS | Status: DC

## 2017-06-05 MED ORDER — METHOCARBAMOL 500 MG PO TABS: 500.0000 mg | ORAL_TABLET | Freq: Four times a day (QID) | ORAL | 0 refills | Status: DC | PRN

## 2017-06-05 NOTE — Progress Notes (Signed)
     Subjective: 3 Days Post-Op Procedure(s) (LRB): TOTAL KNEE BILATERAL (Bilateral)   Patient reports pain as mild, pain controlled. Yesterday had some orthostatic hypotension and a slight issue this morning. He feels that he is doing well.   Objective:   VITALS:   Vitals:   06/05/17 0107 06/05/17 0431  BP: (!) 93/51 (!) 105/54  Pulse: 100 99  Resp:  16  Temp:  98.8 F (37.1 C)  SpO2:  94%    Bilaterally: Dorsiflexion/Plantar flexion intact Incision: dressing C/D/I No cellulitis present Compartment soft  LABS Recent Labs    06/03/17 0534 06/04/17 0536  HGB 12.1* 10.9*  HCT 36.1* 31.8*  WBC 13.2* 12.6*  PLT 176 161    Recent Labs    06/03/17 0534 06/04/17 0536  NA 137 139  K 5.1 4.5  BUN 23* 21*  CREATININE 1.02 0.90  GLUCOSE 156* 129*     Assessment/Plan: 3 Days Post-Op Procedure(s) (LRB): TOTAL KNEE BILATERAL (Bilateral) Up with therapy Discharge home with home health  [facility]. Follow up with OLIN,Anyae Griffith D in 2 weeks.  Contact information:  [facility] [address], [address] [place] [id number] [phone]        Anastasio AuerbachMatthew S. Carmino Ocain   PAC  06/05/2017, 7:58 AM

## 2017-06-05 NOTE — Progress Notes (Signed)
Physical Therapy Treatment Patient Details Name: James RollerDavid D Wesch MRN: [id number] DOB: [birth year] Today's Date: 06/05/2017    History of Present Illness Pt s/p bil TKR    PT Comments    Pt continues very motivated.  Therex program performed and advanced but OOB deferred 2* pt receiving bolus for orthostatic hypotension.  Will follow and progress with OOB as tolerated.   Follow Up Recommendations  Home health PT     Equipment Recommendations  None recommended by PT    Recommendations for Other Services OT consult     Precautions / Restrictions Precautions Precautions: Fall;Knee Restrictions Weight Bearing Restrictions: No RLE Weight Bearing: Weight bearing as tolerated LLE Weight Bearing: Weight bearing as tolerated    Mobility  Bed Mobility                  Transfers                    Ambulation/Gait                 Stairs            Wheelchair Mobility    Modified Rankin (Stroke Patients Only)       Balance                                            Cognition Arousal/Alertness: Awake/alert Behavior During Therapy: WFL for tasks assessed/performed Overall Cognitive Status: Within Functional Limits for tasks assessed                                        Exercises Total Joint Exercises Ankle Circles/Pumps: AROM;Both;Supine;20 reps Quad Sets: AROM;Both;Supine;15 reps Short Arc Quad: AAROM;AROM;Both;15 reps;Supine Heel Slides: AAROM;Both;Supine;15 reps Hip ABduction/ADduction: AAROM;AROM;Both;20 reps;Supine Goniometric ROM: AAROM R knee -8 - 80; L knee -8 - 85    General Comments        Pertinent Vitals/Pain Pain Assessment: 0-10 Pain Score: 5  Pain Location: bil knees Pain Descriptors / Indicators: Aching;Sore Pain Intervention(s): Limited activity within patient's tolerance;Monitored during session;Premedicated before session;Ice applied    Home Living                     Prior Function            PT Goals (current goals can now be found in the care plan section) Acute Rehab PT Goals Patient Stated Goal: Regain IND PT Goal Formulation: With patient Time For Goal Achievement: 06/10/17 Potential to Achieve Goals: Good Progress towards PT goals: Progressing toward goals    Frequency    7X/week      PT Plan Current plan remains appropriate    Co-evaluation              AM-PAC PT "6 Clicks" Daily Activity  Outcome Measure  Difficulty turning over in bed (including adjusting bedclothes, sheets and blankets)?: A Lot Difficulty moving from lying on back to sitting on the side of the bed? : A Lot Difficulty sitting down on and standing up from a chair with arms (e.g., wheelchair, bedside commode, etc,.)?: A Lot Help needed moving to and from a bed to chair (including a wheelchair)?: A Lot Help needed walking in hospital room?: A Lot Help needed climbing 3-5 steps with a  railing? : A Lot 6 Click Score: 12    End of Session Equipment Utilized During Treatment: Gait belt Activity Tolerance: Other (comment)(OOB deferred for pt to complete bolus) Patient left: in bed;with call bell/phone within reach;with family/visitor present   PT Visit Diagnosis: Unsteadiness on feet (R26.81);Difficulty in walking, not elsewhere classified (R26.2)     Time: [id number] PT Time Calculation (min) (ACUTE ONLY): 31 min  Charges:  $Therapeutic Exercise: 23-37 mins                    G Codes:       Pg [phone]    Laketra Bowdish 06/05/2017, 10:57 AM

## 2017-06-05 NOTE — Progress Notes (Signed)
Occupational Therapy Treatment Patient Details Name: James RollerDavid D Burch MRN: [id number] DOB: 24-Jun-1956 Today's Date: 06/05/2017    History of present illness Pt s/p bil TKR   OT comments  All education was completed today.  Follow Up Recommendations  Supervision/Assistance - 24 hour    Equipment Recommendations  None recommended by OT    Recommendations for Other Services      Precautions / Restrictions Precautions Precautions: Fall;Knee Restrictions RLE Weight Bearing: Weight bearing as tolerated LLE Weight Bearing: Weight bearing as tolerated       Mobility Bed Mobility                  Transfers       Sit to Stand: Min assist;From elevated surface;+2 safety/equipment         General transfer comment: cues for LE management and use of UEs to self assist.      Balance                                           ADL either performed or assessed with clinical judgement   ADL                                   Tub/ Shower Transfer: Minimal assistance;Ambulation.  Pt had difficulty clearing LLE 4", but later did so with steps     General ADL Comments: simulated shower stall transfer as pt has been having decreased BP.  While BP dropped from sit to stand, [facility] and pt was not lightheaded. See PT note for full BPs.  Last BP was 117/77 and HR 120.  Pt/wife verbalize understanding of sequence and will have HHPT practice with them when pt is home. Pt used commode earlier with PT     Vision       Perception     Praxis      Cognition Arousal/Alertness: Awake/alert Behavior During Therapy: WFL for tasks assessed/performed Overall Cognitive Status: Within Functional Limits for tasks assessed                                          Exercises     Shoulder Instructions       General Comments      Pertinent Vitals/ Pain       Pain Score: 3  Pain Location: bil knees Pain Descriptors /  Indicators: Aching;Sore Pain Intervention(s): Limited activity within patient's tolerance;Monitored during session;Premedicated before session;Repositioned  Home Living                                          Prior Functioning/Environment              Frequency           Progress Toward Goals  OT Goals(current goals can now be found in the care plan section)  Progress towards OT goals: Pt/family feel comfortable with bathroom transfers.  patient discharged from OT     Plan      Co-evaluation    PT/OT/SLP Co-Evaluation/Treatment: Yes Reason for Co-Treatment: For patient/therapist safety PT goals addressed during session:  Mobility/safety with mobility OT goals addressed during session: ADL's and self-care      AM-PAC PT "6 Clicks" Daily Activity     Outcome Measure   Help from another person eating meals?: None Help from another person taking care of personal grooming?: A Little Help from another person toileting, which includes using toliet, bedpan, or urinal?: A Little Help from another person bathing (including washing, rinsing, drying)?: A Lot Help from another person to put on and taking off regular upper body clothing?: A Little Help from another person to put on and taking off regular lower body clothing?: A Lot 6 Click Score: 17    End of Session    OT Visit Diagnosis: Pain Pain - part of body: Knee(bil)   Activity Tolerance Patient tolerated treatment well   Patient Left in chair;with call bell/phone within reach;with family/visitor present   Nurse Communication          Time: 1434-1500 OT Time Calculation (min): 26 min  Charges: OT General Charges $OT Visit: 1 Visit OT Treatments $Self Care/Home Management : 8-22 mins  Marica OtterMaryellen Anshul Meddings, OTR/L [phone] 06/05/2017   James Burch 06/05/2017, 3:12 PM

## 2017-06-05 NOTE — Progress Notes (Signed)
Physical Therapy Treatment Patient Details Name: James RollerDavid D Burch MRN: [id number] DOB: 1955-08-01 Today's Date: 06/05/2017    History of Present Illness Pt s/p bil TKR    PT Comments    Pt continues very motivated to progress but ltd this am by ongoing dizziness and nausea with OOB activity.  BP sitting on BSC - 118/84 but dropped to 94/54 after stand/pvt to recliner - RN aware.   Follow Up Recommendations  Home health PT     Equipment Recommendations  None recommended by PT    Recommendations for Other Services OT consult     Precautions / Restrictions Precautions Precautions: Fall;Knee Restrictions Weight Bearing Restrictions: No RLE Weight Bearing: Weight bearing as tolerated LLE Weight Bearing: Weight bearing as tolerated    Mobility  Bed Mobility Overal bed mobility: Needs Assistance Bed Mobility: Supine to Sit     Supine to sit: Supervision     General bed mobility comments: increased time but no physical assist  Transfers Overall transfer level: Needs assistance Equipment used: Rolling walker (2 wheeled) Transfers: Sit to/from BJ'sStand;Stand Pivot Transfers Sit to Stand: Min assist;From elevated surface;+2 safety/equipment Stand pivot transfers: Min assist;+2 physical assistance;+2 safety/equipment;From elevated surface       General transfer comment: cues for LE management and use of UEs to self assist.  Pt stand/pvt wtih RW bed>BSC>recliner  Ambulation/Gait             General Gait Details: Stand pvts to chair only 2* ongoing dizziness and nausea   Stairs            Wheelchair Mobility    Modified Rankin (Stroke Patients Only)       Balance                                            Cognition Arousal/Alertness: Awake/alert Behavior During Therapy: WFL for tasks assessed/performed Overall Cognitive Status: Within Functional Limits for tasks assessed                                         Exercises Total Joint Exercises Ankle Circles/Pumps: AROM;Both;Supine;20 reps Quad Sets: AROM;Both;Supine;15 reps Short Arc Quad: AAROM;AROM;Both;15 reps;Supine Heel Slides: AAROM;Both;Supine;15 reps Hip ABduction/ADduction: AAROM;AROM;Both;20 reps;Supine Goniometric ROM: AAROM R knee -8 - 80; L knee -8 - 85    General Comments        Pertinent Vitals/Pain Pain Assessment: 0-10 Pain Score: 4  Pain Location: bil knees Pain Descriptors / Indicators: Aching;Sore Pain Intervention(s): Limited activity within patient's tolerance;Monitored during session;Premedicated before session;Ice applied    Home Living                      Prior Function            PT Goals (current goals can now be found in the care plan section) Acute Rehab PT Goals Patient Stated Goal: Regain IND PT Goal Formulation: With patient Time For Goal Achievement: 06/10/17 Potential to Achieve Goals: Good Progress towards PT goals: Progressing toward goals    Frequency    7X/week      PT Plan Current plan remains appropriate    Co-evaluation              AM-PAC PT "6 Clicks" Daily Activity  Outcome Measure  Difficulty turning over in bed (including adjusting bedclothes, sheets and blankets)?: A Lot Difficulty moving from lying on back to sitting on the side of the bed? : A Lot Difficulty sitting down on and standing up from a chair with arms (e.g., wheelchair, bedside commode, etc,.)?: A Lot Help needed moving to and from a bed to chair (including a wheelchair)?: A Lot Help needed walking in hospital room?: A Lot Help needed climbing 3-5 steps with a railing? : A Lot 6 Click Score: 12    End of Session Equipment Utilized During Treatment: Gait belt Activity Tolerance: Other (comment);Patient limited by fatigue(nausea/dizziness) Patient left: in chair;with call bell/phone within reach;with family/visitor present Nurse Communication: Mobility status PT Visit Diagnosis:  Unsteadiness on feet (R26.81);Difficulty in walking, not elsewhere classified (R26.2)     Time: 9604-54091003-1042 PT Time Calculation (min) (ACUTE ONLY): 39 min  Charges:  $Therapeutic Exercise: 23-37 mins $Therapeutic Activity: 23-37 mins                    G Codes:       Pg [phone]    Dasean Brow 06/05/2017, 11:51 AM

## 2017-06-05 NOTE — Progress Notes (Signed)
Physical Therapy Treatment Patient Details Name: James RollerDavid D Lyall MRN: [id number] DOB: January 05, 1956 Today's Date: 06/05/2017    History of Present Illness Pt s/p bil TKR    PT Comments    Pt progressing with mobility including climbing stairs.  No c/o dizziness this session and pt eager for return home.   Follow Up Recommendations  Home health PT     Equipment Recommendations  None recommended by PT    Recommendations for Other Services OT consult     Precautions / Restrictions Precautions Precautions: Fall;Knee Restrictions Weight Bearing Restrictions: No RLE Weight Bearing: Weight bearing as tolerated LLE Weight Bearing: Weight bearing as tolerated    Mobility  Bed Mobility               General bed mobility comments: Pt OOB and requests back to chair   Transfers Overall transfer level: Needs assistance Equipment used: Rolling walker (2 wheeled) Transfers: Sit to/from Stand Sit to Stand: Min guard;Supervision         General transfer comment: Pt self-cues for technique  Ambulation/Gait Ambulation/Gait assistance: +2 safety/equipment;Min guard;Supervision Ambulation Distance (Feet): 43 Feet Assistive device: Rolling walker (2 wheeled) Gait Pattern/deviations: Decreased step length - right;Decreased step length - left;Step-to pattern;Shuffle;Trunk flexed Gait velocity: decr Gait velocity interpretation: Below normal speed for age/gender General Gait Details: min cues for posture and position from RW   Stairs Stairs: Yes   Stair Management: One rail Right;Forwards;With crutches;Step to pattern Number of Stairs: 2 General stair comments: cues for sequence and foot/crutch placement  Wheelchair Mobility    Modified Rankin (Stroke Patients Only)       Balance                                            Cognition Arousal/Alertness: Awake/alert Behavior During Therapy: WFL for tasks assessed/performed Overall Cognitive Status:  Within Functional Limits for tasks assessed                                        Exercises      General Comments        Pertinent Vitals/Pain Pain Assessment: 0-10 Pain Score: 3  Pain Location: bil knees Pain Descriptors / Indicators: Aching;Sore Pain Intervention(s): Limited activity within patient's tolerance;Monitored during session;Premedicated before session;Ice applied    Home Living                      Prior Function            PT Goals (current goals can now be found in the care plan section) Acute Rehab PT Goals Patient Stated Goal: Regain IND PT Goal Formulation: With patient Time For Goal Achievement: 06/10/17 Potential to Achieve Goals: Good Progress towards PT goals: Progressing toward goals    Frequency    7X/week      PT Plan Current plan remains appropriate    Co-evaluation PT/OT/SLP Co-Evaluation/Treatment: Yes Reason for Co-Treatment: For patient/therapist safety PT goals addressed during session: Mobility/safety with mobility OT goals addressed during session: ADL's and self-care      AM-PAC PT "6 Clicks" Daily Activity  Outcome Measure  Difficulty turning over in bed (including adjusting bedclothes, sheets and blankets)?: A Lot Difficulty moving from lying on back to sitting on the side of the  bed? : A Lot Difficulty sitting down on and standing up from a chair with arms (e.g., wheelchair, bedside commode, etc,.)?: A Lot Help needed moving to and from a bed to chair (including a wheelchair)?: A Little Help needed walking in hospital room?: A Little Help needed climbing 3-5 steps with a railing? : A Little 6 Click Score: 15    End of Session Equipment Utilized During Treatment: Gait belt Activity Tolerance: Patient tolerated treatment well;Patient limited by fatigue Patient left: in chair;with call bell/phone within reach;with family/visitor present Nurse Communication: Mobility status PT Visit Diagnosis:  Unsteadiness on feet (R26.81);Difficulty in walking, not elsewhere classified (R26.2)     Time: [id number] PT Time Calculation (min) (ACUTE ONLY): 40 min  Charges:  $Gait Training: 23-37 mins                    G Codes:       Pg [phone]    Nikie Cid 06/05/2017, 3:46 PM

## 2017-06-09 NOTE — Discharge Summary (Signed)
Physician Discharge Summary  Patient ID: James Burch MRN: [id number] DOB/AGE: 30-Dec-1955 61 y.o.  Admit date: 06/02/2017 Discharge date: 06/05/2017   Procedures:  Procedure(s) (LRB): TOTAL KNEE BILATERAL (Bilateral)  Attending Physician:  Dr. Durene Romans   Admission Diagnoses:   Bilateral knee primary OA / pain  Discharge Diagnoses:  Principal Problem:   S/P bilateral TKAs Active Problems:   Obese   S/p total knee replacement, bilateral  Past Medical History:  Diagnosis Date  . Arthritis   . Sleep apnea    cpap    HPI:    James Burch, 61 y.o. male, has a history of pain and functional disability in the bilateral knee due to arthritis and has failed non-surgical conservative treatments for greater than 12 weeks to include NSAID's and/or analgesics, corticosteriod injections and activity modification.  Onset of symptoms was gradual, starting  years ago with gradually worsening course since that time. The patient noted prior procedures on the knee to include  arthroscopy and menisectomy on the left knee(s).  Patient currently rates pain in the bilateral knee(s) at 7 out of 10 with activity. Patient has night pain, worsening of pain with activity and weight bearing, pain that interferes with activities of daily living, pain with passive range of motion, crepitus and joint swelling.  Patient has evidence of periarticular osteophytes and joint space narrowing by imaging studies.  There is no active infection.  Risks, benefits and expectations were discussed with the patient.  Risks including but not limited to the risk of anesthesia, blood clots, nerve damage, blood vessel damage, failure of the prosthesis, infection and up to and including death.  Patient understand the risks, benefits and expectations and wishes to proceed with surgery.  PCP: Etta Quill, PA-C   Discharged Condition: good  Hospital Course:  Patient underwent the above stated procedure on 06/02/2017. Patient  tolerated the procedure well and brought to the recovery room in good condition and subsequently to the floor.  POD #1 BP: 99/57 ; Pulse: 69 ; Temp: 98.1 F (36.7 C) ; Resp: 13 Patient reports pain as mild, pain controlled. No events throughout the night.  Complaining of nausea this morning, trying to eat some crackers. Bilaterally: Dorsiflexion/plantar flexion intact, incision: dressing C/D/I, no cellulitis present and compartment soft.   LABS  Basename    HGB     12.1  HCT     36.1   POD #2  BP: 125/65 ; Pulse: 78 ; Temp: 98.1 F (36.7 C) ; Resp: 18 Patient reports pain as mild to moderate.  Nausea better at this point.  Still keeping an eye on I&Os. Neurovascular intact and incision: dressing C/D/I.  LABS  Basename    HGB     10.9  HCT     31.8   POD #3  BP: 105/54 ; Pulse: 99 ; Temp: 98.8 F (37.1 C) ; Resp: 16 Patient reports pain as mild, pain controlled. Yesterday had some orthostatic hypotension and a slight issue this morning. He feels that he is doing well. Bilaterally: Dorsiflexion/plantar flexion intact, incision: dressing C/D/I, no cellulitis present and compartment soft.   LABS   No new labs   Discharge Exam: General appearance: alert, cooperative and no distress Extremities: Homans sign is negative, no sign of DVT, no edema, redness or tenderness in the calves or thighs and no ulcers, gangrene or trophic changes  Disposition: Home with follow up in 2 weeks   Follow-up Information    Durene Romans, MD.  Schedule an appointment as soon as possible for a visit in 2 week(s).   Specialty:  Orthopedic Surgery Contact information: [address] [place] [address] [phone]        [facility] Follow up.   Specialty:  Home Health Services Why:  physical therapy Contact information: [address] [place] [address] [phone]        [facility] - Dme Follow up.   Why:  walker Contact  information: [address] [place] [address] [phone]           Discharge Instructions    Call MD / Call 911   Complete by:  As directed    If you experience chest pain or shortness of breath, CALL 911 and be transported to the hospital emergency room.  If you develope a fever above 101 F, pus (white drainage) or increased drainage or redness at the wound, or calf pain, call your surgeon's office.   Change dressing   Complete by:  As directed    Maintain surgical dressing until follow up in the clinic. If the edges start to pull up, may reinforce with tape. If the dressing is no longer working, may remove and cover with gauze and tape, but must keep the area dry and clean.  Call with any questions or concerns.   Constipation Prevention   Complete by:  As directed    Drink plenty of fluids.  Prune juice may be helpful.  You may use a stool softener, such as Colace (over the counter) 100 mg twice a day.  Use MiraLax (over the counter) for constipation as needed.   Diet - low sodium heart healthy   Complete by:  As directed    Discharge instructions   Complete by:  As directed    Maintain surgical dressing until follow up in the clinic. If the edges start to pull up, may reinforce with tape. If the dressing is no longer working, may remove and cover with gauze and tape, but must keep the area dry and clean.  [facility]. Call with any questions or concerns.   Increase activity slowly as tolerated   Complete by:  As directed    Weight bearing as tolerated with assist device (walker, cane, etc) as directed, use it as long as suggested by your surgeon or therapist, typically at least 4-6 weeks.   TED hose   Complete by:  As directed    Use stockings (TED hose) for 2 weeks on both leg(s).  You may remove them at night for sleeping.      Allergies as of 06/05/2017   No Known Allergies     Medication List    TAKE these medications    acetaminophen 500 MG tablet Commonly known as:  TYLENOL Take 2 tablets (1,000 mg total) every 8 (eight) hours as needed by mouth.   aspirin 81 MG chewable tablet Commonly known as:  ASPIRIN CHILDRENS Chew 1 tablet (81 mg total) 2 (two) times daily by mouth. Start taking on:  06/20/2017   docusate sodium 100 MG capsule Commonly known as:  COLACE Take 1 capsule (100 mg total) 2 (two) times daily by mouth.   ferrous sulfate 325 (65 FE) MG tablet Commonly known as:  FERROUSUL Take 1 tablet (325 mg total) 3 (three) times daily with meals by mouth.   FISH OIL PO Take 1 capsule by mouth daily.  glucosamine-chondroitin 500-400 MG tablet Take 1 tablet by mouth daily.   HYDROcodone-acetaminophen 7.5-325 MG tablet Commonly known as:  NORCO Take 1-2 tablets every 4 (four) hours as needed by mouth for moderate pain.   methocarbamol 500 MG tablet Commonly known as:  ROBAXIN Take 1 tablet (500 mg total) every 6 (six) hours as needed by mouth for muscle spasms.   polyethylene glycol packet Commonly known as:  MIRALAX / GLYCOLAX Take 17 g 2 (two) times daily by mouth.   pravastatin 40 MG tablet Commonly known as:  PRAVACHOL Take 40 mg by mouth daily.   rivaroxaban 10 MG Tabs tablet Commonly known as:  XARELTO Take 1 tablet (10 mg total) daily for 14 days by mouth.            Discharge Care Instructions  (From admission, onward)        Start     Ordered   06/03/17 0000  Change dressing    Comments:  Maintain surgical dressing until follow up in the clinic. If the edges start to pull up, may reinforce with tape. If the dressing is no longer working, may remove and cover with gauze and tape, but must keep the area dry and clean.  Call with any questions or concerns.   06/03/17 0802       Signed: Anastasio AuerbachMatthew S. Avilene Marrin   PA-C  06/09/2017, 8:56 PM

## 2017-07-29 DIAGNOSIS — M25561 Pain in right knee: Secondary | ICD-10-CM | POA: Diagnosis not present

## 2017-07-29 DIAGNOSIS — M25562 Pain in left knee: Secondary | ICD-10-CM | POA: Diagnosis not present

## 2017-07-29 DIAGNOSIS — Z471 Aftercare following joint replacement surgery: Secondary | ICD-10-CM | POA: Diagnosis not present

## 2017-07-31 DIAGNOSIS — M25562 Pain in left knee: Secondary | ICD-10-CM | POA: Diagnosis not present

## 2017-07-31 DIAGNOSIS — M25561 Pain in right knee: Secondary | ICD-10-CM | POA: Diagnosis not present

## 2017-07-31 DIAGNOSIS — Z471 Aftercare following joint replacement surgery: Secondary | ICD-10-CM | POA: Diagnosis not present

## 2017-09-24 DIAGNOSIS — Z96653 Presence of artificial knee joint, bilateral: Secondary | ICD-10-CM | POA: Diagnosis not present

## 2017-11-05 DIAGNOSIS — M170 Bilateral primary osteoarthritis of knee: Secondary | ICD-10-CM | POA: Diagnosis not present

## 2018-05-20 ENCOUNTER — Encounter: Payer: Self-pay | Admitting: Physician Assistant

## 2018-05-20 ENCOUNTER — Ambulatory Visit (INDEPENDENT_AMBULATORY_CARE_PROVIDER_SITE_OTHER): Payer: 59 | Admitting: Physician Assistant

## 2018-05-20 VITALS — BP 132/98 | HR 93 | Temp 97.8°F | Ht 71.0 in | Wt 250.0 lb

## 2018-05-20 DIAGNOSIS — Z131 Encounter for screening for diabetes mellitus: Secondary | ICD-10-CM

## 2018-05-20 DIAGNOSIS — Z7689 Persons encountering health services in other specified circumstances: Secondary | ICD-10-CM | POA: Diagnosis not present

## 2018-05-20 DIAGNOSIS — Z8042 Family history of malignant neoplasm of prostate: Secondary | ICD-10-CM

## 2018-05-20 DIAGNOSIS — R030 Elevated blood-pressure reading, without diagnosis of hypertension: Secondary | ICD-10-CM | POA: Diagnosis not present

## 2018-05-20 DIAGNOSIS — Z125 Encounter for screening for malignant neoplasm of prostate: Secondary | ICD-10-CM

## 2018-05-20 DIAGNOSIS — J069 Acute upper respiratory infection, unspecified: Secondary | ICD-10-CM

## 2018-05-20 DIAGNOSIS — Z96653 Presence of artificial knee joint, bilateral: Secondary | ICD-10-CM

## 2018-05-20 DIAGNOSIS — E782 Mixed hyperlipidemia: Secondary | ICD-10-CM | POA: Diagnosis not present

## 2018-05-20 DIAGNOSIS — G4733 Obstructive sleep apnea (adult) (pediatric): Secondary | ICD-10-CM

## 2018-05-20 DIAGNOSIS — Z1211 Encounter for screening for malignant neoplasm of colon: Secondary | ICD-10-CM

## 2018-05-20 DIAGNOSIS — Z130 Encounter for screening for diseases of the blood and blood-forming organs and certain disorders involving the immune mechanism: Secondary | ICD-10-CM

## 2018-05-20 DIAGNOSIS — Z1159 Encounter for screening for other viral diseases: Secondary | ICD-10-CM

## 2018-05-20 MED ORDER — IPRATROPIUM BROMIDE 0.06 % NA SOLN: 2.0000 | Freq: Four times a day (QID) | NASAL | 0 refills | Status: DC | PRN

## 2018-05-20 NOTE — Progress Notes (Signed)
HPI:                                                                James Burch is a 62 y.o. male who presents to [facility] Primary Care Sports Medicine today to establish care  Current concerns: cold symptoms, cholesterol  Reports he has had nasal congestion and sinus pressure x 1 week. He has an occasional cough. Endorses some fatigue for 1 day, but energy level is returning.  Denies fever, chills, malaise, hemoptysis, chest pain  HLD - currently taking Pravastatin and fish oil daily. Compliant with medications. Last LDL 75 11/27/16. Due for fasting labs  OSA - was followed by Dr. Jerre Simon, Pulmonology. Reports he does not use his CPAP consistently     Depression screen [facility] 2/9 05/20/2018  Decreased Interest 0  Down, Depressed, Hopeless 0  PHQ - 2 Score 0  Altered sleeping 0  Tired, decreased energy 0  Change in appetite 0  Feeling bad or failure about yourself  0  Trouble concentrating 0  Moving slowly or fidgety/restless 0  Suicidal thoughts 0  PHQ-9 Score 0  Difficult doing work/chores Not difficult at all    GAD 7 : Generalized Anxiety Score 05/20/2018  Nervous, Anxious, on Edge 0  Control/stop worrying 0  Worry too much - different things 0  Trouble relaxing 0  Restless 0  Easily annoyed or irritable 0  Afraid - awful might happen 0  Total GAD 7 Score 0  Anxiety Difficulty Not difficult at all      Past Medical History:  Diagnosis Date  . Arthritis   . Hyperlipidemia   . Obesity   . Sleep apnea    cpap   Past Surgical History:  Procedure Laterality Date  . HERNIA REPAIR     Left inguinal  . KNEE ARTHROSCOPY     Left  . REFRACTIVE SURGERY    . TOTAL KNEE ARTHROPLASTY Bilateral 06/02/2017   Procedure: TOTAL KNEE BILATERAL;  Surgeon: Durene Romans, MD;  Location: [facility];  Service: Orthopedics;  Laterality: Bilateral;  2 hrs   Social History   Tobacco Use  . Smoking status: Never Smoker  . Smokeless tobacco: Never Used   Substance Use Topics  . Alcohol use: Never    Frequency: Never   family history includes Heart disease in his mother; Hypertension in his mother; Lung cancer in his mother; Prostate cancer in his father; Stroke in his mother.    ROS: negative except as noted in the HPI  Medications: Current Outpatient Medications  Medication Sig Dispense Refill  . glucosamine-chondroitin 500-400 MG tablet Take 1 tablet by mouth daily.    [name] ipratropium (ATROVENT) 0.06 % nasal spray Place 2 sprays into both nostrils 4 (four) times daily as needed for rhinitis. 15 mL 0  . Omega-3 Fatty Acids (FISH OIL PO) Take 1 capsule by mouth daily.    . pravastatin (PRAVACHOL) 40 MG tablet Take 40 mg by mouth daily.     No current facility-administered medications for this visit.    No Known Allergies     Objective:  BP (!) 132/98   Pulse 93   Temp 97.8 F (36.6 C) (Oral)   Ht 5\' 11"  (1.803 m)   Wt 250 lb (  113.4 kg)   SpO2 96%   BMI 34.87 kg/m  Gen:  alert, not ill-appearing, no distress, appropriate for age HEENT: head normocephalic without obvious abnormality, conjunctiva and cornea clear, right TM with air-fluid level, no bulging or erythema, left TM clear, oropharynx clear, grde 2 tonsils, no exudates, uvula midline, no sinus tenderness, neck supple, no cervical adenopathy, trachea midline Pulm: Normal work of breathing, normal phonation, clear to auscultation bilaterally, no wheezes, rales or rhonchi CV: Normal rate, regular rhythm, s1 and s2 distinct, no murmurs, clicks or rubs  Neuro: alert and oriented x 3, no tremor MSK: extremities atraumatic, normal gait and station Skin: intact, no rashes on exposed skin, no jaundice, no cyanosis Psych: well-groomed, cooperative, good eye contact, euthymic mood, affect mood-congruent, speech is articulate, and thought processes clear and goal-directed    No results found for this or any previous visit (from the past 72 hour(s)). No results  found.    Assessment and Plan: 62 y.o. male with   .Diagnoses and all orders for this visit:  Encounter to establish care  Mixed hyperlipidemia -     Lipid Panel w/reflex Direct LDL  Elevated blood pressure reading  OSA (obstructive sleep apnea) Comments: variable compliance  Status post bilateral knee replacements  Acute upper respiratory infection -     ipratropium (ATROVENT) 0.06 % nasal spray; Place 2 sprays into both nostrils 4 (four) times daily as needed for rhinitis.  Family history of prostate cancer in father Comments: diagnosed in late 55's Orders: -     PSA  Colon cancer screening -     Ambulatory referral to Gastroenterology  Screening for blood disease -     CBC -     COMPLETE METABOLIC PANEL WITH GFR  Screening for diabetes mellitus -     COMPLETE METABOLIC PANEL WITH GFR  Screening PSA (prostate specific antigen) -     PSA  Encounter for hepatitis C screening test for low risk patient -     Hepatitis C antibody   - Personally reviewed PMH, PSH, PFH, medications, allergies, HM - Age-appropriate cancer screening: PSA pending family hx of prostate cancer, per patient due for Colonoscopy, referral placed - Influenza and Tdap UTD - PHQ2 negative - Fasting labs ordered in advance of annual CPE  Acute URI- afebrile, no tachypnea, no tachycardia, symptoms present for 1 week. Supportive care  Patient education and anticipatory guidance given Patient agrees with treatment plan Follow-up in approx 1 week for annual exam or sooner as needed if symptoms worsen or fail to improve  Levonne Hubert PA-C

## 2018-05-20 NOTE — Patient Instructions (Addendum)
I have also ordered fasting labs. The lab is [facility] located on the first floor a walk-in open M-F 7:30a-4:30p (closed 12:30-1:30p). Nothing to eat or drink after midnight or at least 8 hours before your blood draw. You can have water and your medications.    For nasal symptoms/sinusitis: - Atrovent nasal spray, 2 sprays every 6 hours as needed - nasal saline rinses / netti pot (do this prior to nasal spray) - warm facial compresses - oral decongestants and antihistamines like Claritin-D and Zyrtec-D may help dry up secretions (caution using decongestants if you have high blood pressure, heart disease or kidney disease) - for sinus headache: Tylenol 1000mg  every 8 hours as needed. Can alternate with Ibuprofen 600mg  every 6 hours   Note: follow package instructions for all over-the-counter medications. If using multi-symptom medications (Dayquil, Theraflu, etc.), check the label for duplicate drug ingredients.   Upper Respiratory Infection, Adult Most upper respiratory infections (URIs) are a viral infection of the air passages leading to the lungs. A URI affects the nose, throat, and upper air passages. The most common type of URI is nasopharyngitis and is typically referred to as "the common cold." URIs run their course and usually go away on their own. Most of the time, a URI does not require medical attention, but sometimes a bacterial infection in the upper airways can follow a viral infection. This is called a secondary infection. Sinus and middle ear infections are common types of secondary upper respiratory infections. Bacterial pneumonia can also complicate a URI. A URI can worsen asthma and chronic obstructive pulmonary disease (COPD). Sometimes, these complications can require emergency medical care and may be life threatening. What are the causes? Almost all URIs are caused by viruses. A virus is a type of germ and can spread from one person to another. What increases the risk? You may  be at risk for a URI if:  You smoke.  You have chronic heart or lung disease.  You have a weakened defense (immune) system.  You are very young or very old.  You have nasal allergies or asthma.  You work in crowded or poorly ventilated areas.  You work in health care facilities or schools.  What are the signs or symptoms? Symptoms typically develop 2-3 days after you come in contact with a cold virus. Most viral URIs last 7-10 days. However, viral URIs from the influenza virus (flu virus) can last 14-18 days and are typically more severe. Symptoms may include:  Runny or stuffy (congested) nose.  Sneezing.  Cough.  Sore throat.  Headache.  Fatigue.  Fever.  Loss of appetite.  Pain in your forehead, behind your eyes, and over your cheekbones (sinus pain).  Muscle aches.  How is this diagnosed? Your health care provider may diagnose a URI by:  Physical exam.  Tests to check that your symptoms are not due to another condition such as: ? Strep throat. ? Sinusitis. ? Pneumonia. ? Asthma.  How is this treated? A URI goes away on its own with time. It cannot be cured with medicines, but medicines may be prescribed or recommended to relieve symptoms. Medicines may help:  Reduce your fever.  Reduce your cough.  Relieve nasal congestion.  Follow these instructions at home:  Take medicines only as directed by your health care provider.  Gargle warm saltwater or take cough drops to comfort your throat as directed by your health care provider.  Use a warm mist humidifier or inhale steam from a shower  to increase air moisture. This may make it easier to breathe.  Drink enough fluid to keep your urine clear or pale yellow.  Eat soups and other clear broths and maintain good nutrition.  Rest as needed.  Return to work when your temperature has returned to normal or as your health care provider advises. You may need to stay home longer to avoid infecting others.  You can also use a face mask and careful hand washing to prevent spread of the virus.  Increase the usage of your inhaler if you have asthma.  Do not use any tobacco products, including cigarettes, chewing tobacco, or electronic cigarettes. If you need help quitting, ask your health care provider. How is this prevented? The best way to protect yourself from getting a cold is to practice good hygiene.  Avoid oral or hand contact with people with cold symptoms.  Wash your hands often if contact occurs.  There is no clear evidence that vitamin C, vitamin E, echinacea, or exercise reduces the chance of developing a cold. However, it is always recommended to get plenty of rest, exercise, and practice good nutrition. Contact a health care provider if:  You are getting worse rather than better.  Your symptoms are not controlled by medicine.  You have chills.  You have worsening shortness of breath.  You have brown or red mucus.  You have yellow or brown nasal discharge.  You have pain in your face, especially when you bend forward.  You have a fever.  You have swollen neck glands.  You have pain while swallowing.  You have white areas in the back of your throat. Get help right away if:  You have severe or persistent: ? Headache. ? Ear pain. ? Sinus pain. ? Chest pain.  You have chronic lung disease and any of the following: ? Wheezing. ? Prolonged cough. ? Coughing up blood. ? A change in your usual mucus.  You have a stiff neck.  You have changes in your: ? Vision. ? Hearing. ? Thinking. ? Mood. This information is not intended to replace advice given to you by your health care provider. Make sure you discuss any questions you have with your health care provider. Document Released: 01/07/2001 Document Revised: 03/16/2016 Document Reviewed: 10/19/2013 Elsevier Interactive Patient Education  [employer].

## 2018-05-25 DIAGNOSIS — Z8042 Family history of malignant neoplasm of prostate: Secondary | ICD-10-CM | POA: Diagnosis not present

## 2018-05-25 DIAGNOSIS — Z1159 Encounter for screening for other viral diseases: Secondary | ICD-10-CM | POA: Diagnosis not present

## 2018-05-25 DIAGNOSIS — Z125 Encounter for screening for malignant neoplasm of prostate: Secondary | ICD-10-CM | POA: Diagnosis not present

## 2018-05-25 DIAGNOSIS — E782 Mixed hyperlipidemia: Secondary | ICD-10-CM | POA: Diagnosis not present

## 2018-05-26 LAB — COMPLETE METABOLIC PANEL WITH GFR
AG Ratio: 1.6 (calc) (ref 1.0–2.5)
ALBUMIN MSPROF: 4.4 g/dL (ref 3.6–5.1)
ALT: 14 U/L (ref 9–46)
AST: 19 U/L (ref 10–35)
Alkaline phosphatase (APISO): 83 U/L (ref 40–115)
BUN: 17 mg/dL (ref 7–25)
CALCIUM: 9.6 mg/dL (ref 8.6–10.3)
CHLORIDE: 100 mmol/L (ref 98–110)
CO2: 29 mmol/L (ref 20–32)
Creat: 1.09 mg/dL (ref 0.70–1.25)
GFR, EST AFRICAN AMERICAN: 84 mL/min/{1.73_m2} (ref >=60)
GFR, EST NON AFRICAN AMERICAN: 73 mL/min/{1.73_m2} (ref >=60)
GLUCOSE: 82 mg/dL (ref 65–99)
Globulin: 2.7 g/dL (calc) (ref 1.9–3.7)
Potassium: 4.3 mmol/L (ref 3.5–5.3)
Sodium: 138 mmol/L (ref 135–146)
TOTAL PROTEIN: 7.1 g/dL (ref 6.1–8.1)
Total Bilirubin: 0.7 mg/dL (ref 0.2–1.2)

## 2018-05-26 LAB — CBC
HCT: 44.8 % (ref 38.5–50.0)
Hemoglobin: 15.5 g/dL (ref 13.2–17.1)
MCH: 29.8 pg (ref 27.0–33.0)
MCHC: 34.6 g/dL (ref 32.0–36.0)
MCV: 86.2 fL (ref 80.0–100.0)
MPV: 10.9 fL (ref 7.5–12.5)
PLATELETS: 267 10*3/uL (ref 140–400)
RBC: 5.2 10*6/uL (ref 4.20–5.80)
RDW: 12 % (ref 11.0–15.0)
WBC: 7.4 10*3/uL (ref 3.8–10.8)

## 2018-05-26 LAB — LIPID PANEL W/REFLEX DIRECT LDL
Cholesterol: 173 mg/dL (ref <200)
HDL: 45 mg/dL (ref >40)
LDL CHOLESTEROL (CALC): 113 mg/dL — AB
Non-HDL Cholesterol (Calc): 128 mg/dL (calc) (ref <130)
TRIGLYCERIDES: 66 mg/dL (ref <150)
Total CHOL/HDL Ratio: 3.8 (calc) (ref <5.0)

## 2018-05-26 LAB — HEPATITIS C ANTIBODY
HEP C AB: NONREACTIVE
SIGNAL TO CUT-OFF: 0.01 (ref <1.00)

## 2018-05-26 LAB — PSA: PSA: 0.5 ng/mL (ref <=4.0)

## 2018-06-17 DIAGNOSIS — Z471 Aftercare following joint replacement surgery: Secondary | ICD-10-CM | POA: Diagnosis not present

## 2018-06-17 DIAGNOSIS — Z96653 Presence of artificial knee joint, bilateral: Secondary | ICD-10-CM | POA: Diagnosis not present

## 2018-08-23 DIAGNOSIS — Z1211 Encounter for screening for malignant neoplasm of colon: Secondary | ICD-10-CM | POA: Diagnosis not present

## 2018-08-23 LAB — HM COLONOSCOPY

## 2018-08-26 ENCOUNTER — Encounter: Payer: Self-pay | Admitting: Physician Assistant

## 2018-12-07 ENCOUNTER — Other Ambulatory Visit: Payer: Self-pay

## 2018-12-07 MED ORDER — PRAVASTATIN SODIUM 40 MG PO TABS: 40.0000 mg | ORAL_TABLET | Freq: Every day | ORAL | 0 refills | Status: DC

## 2019-02-12 ENCOUNTER — Other Ambulatory Visit: Payer: Self-pay | Admitting: Physician Assistant

## 2019-06-14 ENCOUNTER — Other Ambulatory Visit: Payer: Self-pay

## 2019-06-14 ENCOUNTER — Encounter: Payer: Self-pay | Admitting: Physician Assistant

## 2019-06-14 ENCOUNTER — Ambulatory Visit (INDEPENDENT_AMBULATORY_CARE_PROVIDER_SITE_OTHER): Payer: 59 | Admitting: Physician Assistant

## 2019-06-14 VITALS — BP 140/88 | HR 76 | Ht 71.0 in | Wt 270.0 lb

## 2019-06-14 DIAGNOSIS — G4733 Obstructive sleep apnea (adult) (pediatric): Secondary | ICD-10-CM | POA: Diagnosis not present

## 2019-06-14 DIAGNOSIS — Z23 Encounter for immunization: Secondary | ICD-10-CM

## 2019-06-14 DIAGNOSIS — Z8042 Family history of malignant neoplasm of prostate: Secondary | ICD-10-CM

## 2019-06-14 DIAGNOSIS — E782 Mixed hyperlipidemia: Secondary | ICD-10-CM

## 2019-06-14 DIAGNOSIS — Z6837 Body mass index (BMI) 37.0-37.9, adult: Secondary | ICD-10-CM

## 2019-06-14 DIAGNOSIS — Z131 Encounter for screening for diabetes mellitus: Secondary | ICD-10-CM

## 2019-06-14 DIAGNOSIS — R030 Elevated blood-pressure reading, without diagnosis of hypertension: Secondary | ICD-10-CM

## 2019-06-14 DIAGNOSIS — E6609 Other obesity due to excess calories: Secondary | ICD-10-CM

## 2019-06-14 DIAGNOSIS — Z0000 Encounter for general adult medical examination without abnormal findings: Secondary | ICD-10-CM | POA: Diagnosis not present

## 2019-06-14 MED ORDER — PRAVASTATIN SODIUM 40 MG PO TABS: 40.0000 mg | ORAL_TABLET | Freq: Every day | ORAL | 3 refills | Status: DC

## 2019-06-14 NOTE — Progress Notes (Signed)
Subjective:    Patient ID: James Burch, male    DOB: 1956/01/05, 63 y.o.   MRN: [id number]  HPI  Patient is a 63 year old male with OSA on CPAP and mixed hyperlipidemia who presents to the clinic for his annual exam.  Patient is using his CPAP nightly for at least 6 hours and doing well.  He has had much benefit since starting his CPAP.  Patient has gained about 20 pounds in the last year.  He blames most of this on the gyms closing due to COVID-19 pandemic.  .. Active Ambulatory Problems    Diagnosis Date Noted  . Obesity, unspecified obesity severity, unspecified obesity type 08/17/2014  . Status post bilateral knee replacements 06/03/2017  . History of actinic keratosis 07/26/2013  . OSA (obstructive sleep apnea) 08/17/2014  . Left inguinal hernia 06/11/2016  . Mixed hyperlipidemia 05/20/2018  . Elevated blood pressure reading 05/20/2018  . Family history of prostate cancer in father 05/20/2018   Resolved Ambulatory Problems    Diagnosis Date Noted  . S/P bilateral TKAs 06/02/2017   Past Medical History:  Diagnosis Date  . Arthritis   . Hyperlipidemia   . Obesity   . Sleep apnea       Review of Systems  See HPI.      Objective:   Physical Exam Vitals signs reviewed.    BP 140/88   Pulse 76   Ht 5\' 11"  (1.803 m)   Wt 270 lb (122.5 kg)   SpO2 100%   BMI 37.66 kg/m   General Appearance:    Alert, cooperative, no distress, appears stated age  Head:    Normocephalic, without obvious abnormality, atraumatic  Eyes:    PERRL, conjunctiva/corneas clear, EOM's intact, fundi    benign, both eyes       Ears:    Normal TM's and external ear canals, both ears  Nose:   Nares normal, septum midline, mucosa normal, no drainage    or sinus tenderness  Throat:   Lips, mucosa, and tongue normal; teeth and gums normal  Neck:   Supple, symmetrical, trachea midline, no adenopathy;       thyroid:  No enlargement/tenderness/nodules; no carotid   bruit or JVD  Back:      Symmetric, no curvature, ROM normal, no CVA tenderness  Lungs:     Clear to auscultation bilaterally, respirations unlabored  Chest wall:    No tenderness or deformity  Heart:    Regular rate and rhythm, S1 and S2 normal, no murmur, rub   or gallop  Abdomen:     Soft, non-tender, bowel sounds active all four quadrants,    no masses, no organomegaly        Extremities:   Extremities normal, atraumatic, no cyanosis or edema  Pulses:   2+ and symmetric all extremities  Skin:   Skin color, texture, turgor normal, no rashes or lesions  Lymph nodes:   Cervical, supraclavicular, and axillary nodes normal  Neurologic:   CNII-XII intact. Normal strength, sensation and reflexes      throughout    . Depression screen [facility] 2/9 06/14/2019 05/20/2018  Decreased Interest 0 0  Down, Depressed, Hopeless 0 0  PHQ - 2 Score 0 0  Altered sleeping 0 0  Tired, decreased energy 0 0  Change in appetite 0 0  Feeling bad or failure about yourself  0 0  Trouble concentrating 0 0  Moving slowly or fidgety/restless 0 0  Suicidal thoughts 0 0  PHQ-9 Score 0 0  Difficult doing work/chores Not difficult at all Not difficult at all   .James Burch [place] Name 06/14/19 1600         During the last Month   Sensation of Bladder not Empty  Not at all     Urinate<2 hours after last  Not at all     Mult. stop/start when voiding  Not at all     Difficult to postpone voiding  Not at all     Weak urinary stream  Not at all     Push/strain to begin urination  Not at all     Times per night up to urinate  Not at all       OTHER   Total Score  0            Assessment & Plan:  James KitchenMarland KitchenAleczander was seen today for annual exam.  Diagnoses and all orders for this visit:  Routine physical examination -     Lipid Panel w/reflex Direct LDL -     COMPLETE METABOLIC PANEL WITH GFR -     CBC -     PSA  Flu vaccine need -     REGULAR FLU SHOT  OSA (obstructive sleep apnea)  Family history of prostate cancer  in father -     PSA  Screening for diabetes mellitus -     COMPLETE METABOLIC PANEL WITH GFR  Mixed hyperlipidemia -     Lipid Panel w/reflex Direct LDL -     pravastatin (PRAVACHOL) 40 MG tablet; Take 1 tablet (40 mg total) by mouth daily.  Need for shingles vaccine -     SHINGLES  Elevated blood pressure reading   .James KitchenStart a regular exercise program and make sure you are eating a healthy diet Try to eat 4 servings of dairy a day or take a calcium supplement (500mg  twice a day). PHQ-9 great today. We will get fasting labs today. PSA ordered for screening of prostate.AUA negative. Colonoscopy is up-to-date with follow-up in 10 years. Flu shot and for shingles shot given today.  Continue on CPAP for OSA.  Discussed elevated blood pressure reading.  Discussed risk factors for blood pressure continuing to be elevated or moving in that direction.  Second recheck of blood pressure did improve.  Patient was given a handout of the DASH diet.  Patient is going to make some concerted effort to lower his sodium and exercise.  He has gained 20 pounds in the last year.  Hopefully if he is some this weight off his blood pressure will improve.  Suggest him to get a home blood pressure cuff and start monitoring this periodically.  Follow-up in 3 to 6 months with PCP to take a closer look at his blood pressure trends.

## 2019-06-14 NOTE — Patient Instructions (Addendum)
Health Maintenance, Male Adopting a healthy lifestyle and getting preventive care are important in promoting health and wellness. Ask your health care provider about:  The right schedule for you to have regular tests and exams.  Things you can do on your own to prevent diseases and keep yourself healthy. What should I know about diet, weight, and exercise? Eat a healthy diet   Eat a diet that includes plenty of vegetables, fruits, low-fat dairy products, and lean protein.  Do not eat a lot of foods that are high in solid fats, added sugars, or sodium. Maintain a healthy weight Body mass index (BMI) is a measurement that can be used to identify possible weight problems. It estimates body fat based on height and weight. Your health care provider can help determine your BMI and help you achieve or maintain a healthy weight. Get regular exercise Get regular exercise. This is one of the most important things you can do for your health. Most adults should:  Exercise for at least 150 minutes each week. The exercise should increase your heart rate and make you sweat (moderate-intensity exercise).  Do strengthening exercises at least twice a week. This is in addition to the moderate-intensity exercise.  Spend less time sitting. Even light physical activity can be beneficial. Watch cholesterol and blood lipids Have your blood tested for lipids and cholesterol at 62 years of age, then have this test every 5 years. You may need to have your cholesterol levels checked more often if:  Your lipid or cholesterol levels are high.  You are older than 62 years of age.  You are at high risk for heart disease. What should I know about cancer screening? Many types of cancers can be detected early and may often be prevented. Depending on your health history and family history, you may need to have cancer screening at various ages. This may include screening for:  Colorectal cancer.  Prostate  cancer.  Skin cancer.  Lung cancer. What should I know about heart disease, diabetes, and high blood pressure? Blood pressure and heart disease  High blood pressure causes heart disease and increases the risk of stroke. This is more likely to develop in people who have high blood pressure readings, are of African descent, or are overweight.  Talk with your health care provider about your target blood pressure readings.  Have your blood pressure checked: ? Every 3-5 years if you are 18-39 years of age. ? Every year if you are 40 years old or older.  If you are between the ages of 65 and 75 and are a current or former smoker, ask your health care provider if you should have a one-time screening for abdominal aortic aneurysm (AAA). Diabetes Have regular diabetes screenings. This checks your fasting blood sugar level. Have the screening done:  Once every three years after age 45 if you are at a normal weight and have a low risk for diabetes.  More often and at a younger age if you are overweight or have a high risk for diabetes. What should I know about preventing infection? Hepatitis B If you have a higher risk for hepatitis B, you should be screened for this virus. Talk with your health care provider to find out if you are at risk for hepatitis B infection. Hepatitis C Blood testing is recommended for:  Everyone born from 1945 through 1965.  Anyone with known risk factors for hepatitis C. Sexually transmitted infections (STIs)  You should be screened each year   for STIs, including gonorrhea and chlamydia, if: ? You are sexually active and are younger than 62 years of age. ? You are older than 62 years of age and your health care provider tells you that you are at risk for this type of infection. ? Your sexual activity has changed since you were last screened, and you are at increased risk for chlamydia or gonorrhea. Ask your health care provider if you are at risk.  Ask your  health care provider about whether you are at high risk for HIV. Your health care provider may recommend a prescription medicine to help prevent HIV infection. If you choose to take medicine to prevent HIV, you should first get tested for HIV. You should then be tested every 3 months for as long as you are taking the medicine. Follow these instructions at home: Lifestyle  Do not use any products that contain nicotine or tobacco, such as cigarettes, e-cigarettes, and chewing tobacco. If you need help quitting, ask your health care provider.  Do not use street drugs.  Do not share needles.  Ask your health care provider for help if you need support or information about quitting drugs. Alcohol use  Do not drink alcohol if your health care provider tells you not to drink.  If you drink alcohol: ? Limit how much you have to 0-2 drinks a day. ? Be aware of how much alcohol is in your drink. In the [place] one drink equals one 12 oz bottle of beer (355 mL), one 5 oz glass of wine (148 mL), or one 1 oz glass of hard liquor (44 mL). General instructions  Schedule regular health, dental, and eye exams.  Stay current with your vaccines.  Tell your health care provider if: ? You often feel depressed. ? You have ever been abused or do not feel safe at home. Summary  Adopting a healthy lifestyle and getting preventive care are important in promoting health and wellness.  Follow your health care provider's instructions about healthy diet, exercising, and getting tested or screened for diseases.  Follow your health care provider's instructions on monitoring your cholesterol and blood pressure. This information is not intended to replace advice given to you by your health care provider. Make sure you discuss any questions you have with your health care provider. Document Released: 01/10/2008 Document Revised: 07/07/2018 Document Reviewed: 07/07/2018 Elsevier Patient Education  2020 Elsevier  Inc.  DASH Eating Plan DASH stands for "Dietary Approaches to Stop Hypertension." The DASH eating plan is a healthy eating plan that has been shown to reduce high blood pressure (hypertension). It may also reduce your risk for type 2 diabetes, heart disease, and stroke. The DASH eating plan may also help with weight loss. What are tips for following this plan?  General guidelines  Avoid eating more than 2,300 mg (milligrams) of salt (sodium) a day. If you have hypertension, you may need to reduce your sodium intake to 1,500 mg a day.  Limit alcohol intake to no more than 1 drink a day for nonpregnant women and 2 drinks a day for men. One drink equals 12 oz of beer, 5 oz of wine, or 1 oz of hard liquor.  Work with your health care provider to maintain a healthy body weight or to lose weight. Ask what an ideal weight is for you.  Get at least 30 minutes of exercise that causes your heart to beat faster (aerobic exercise) most days of the week. Activities may include walking, swimming,   or biking.  Work with your health care provider or diet and nutrition specialist (dietitian) to adjust your eating plan to your individual calorie needs. Reading food labels   Check food labels for the amount of sodium per serving. Choose foods with less than 5 percent of the Daily Value of sodium. Generally, foods with less than 300 mg of sodium per serving fit into this eating plan.  To find whole grains, look for the word "whole" as the first word in the ingredient list. Shopping  Buy products labeled as "low-sodium" or "no salt added."  Buy fresh foods. Avoid canned foods and premade or frozen meals. Cooking  Avoid adding salt when cooking. Use salt-free seasonings or herbs instead of table salt or sea salt. Check with your health care provider or pharmacist before using salt substitutes.  Do not fry foods. Cook foods using healthy methods such as baking, boiling, grilling, and broiling  instead.  Cook with heart-healthy oils, such as olive, canola, soybean, or sunflower oil. Meal planning  Eat a balanced diet that includes: ? 5 or more servings of fruits and vegetables each day. At each meal, try to fill half of your plate with fruits and vegetables. ? Up to 6-8 servings of whole grains each day. ? Less than 6 oz of lean meat, poultry, or fish each day. A 3-oz serving of meat is about the same size as a deck of cards. One egg equals 1 oz. ? 2 servings of low-fat dairy each day. ? A serving of nuts, seeds, or beans 5 times each week. ? Heart-healthy fats. Healthy fats called Omega-3 fatty acids are found in foods such as flaxseeds and coldwater fish, like sardines, salmon, and mackerel.  Limit how much you eat of the following: ? Canned or prepackaged foods. ? Food that is high in trans fat, such as fried foods. ? Food that is high in saturated fat, such as fatty meat. ? Sweets, desserts, sugary drinks, and other foods with added sugar. ? Full-fat dairy products.  Do not salt foods before eating.  Try to eat at least 2 vegetarian meals each week.  Eat more home-cooked food and less restaurant, buffet, and fast food.  When eating at a restaurant, ask that your food be prepared with less salt or no salt, if possible. What foods are recommended? The items listed may not be a complete list. Talk with your dietitian about what dietary choices are best for you. Grains Whole-grain or whole-wheat bread. Whole-grain or whole-wheat pasta. Brown rice. Oatmeal. Quinoa. Bulgur. Whole-grain and low-sodium cereals. Pita bread. Low-fat, low-sodium crackers. Whole-wheat flour tortillas. Vegetables Fresh or frozen vegetables (raw, steamed, roasted, or grilled). Low-sodium or reduced-sodium tomato and vegetable juice. Low-sodium or reduced-sodium tomato sauce and tomato paste. Low-sodium or reduced-sodium canned vegetables. Fruits All fresh, dried, or frozen fruit. Canned fruit in  natural juice (without added sugar). Meat and other protein foods Skinless chicken or turkey. Ground chicken or turkey. Pork with fat trimmed off. Fish and seafood. Egg whites. Dried beans, peas, or lentils. Unsalted nuts, nut butters, and seeds. Unsalted canned beans. Lean cuts of beef with fat trimmed off. Low-sodium, lean deli meat. Dairy Low-fat (1%) or fat-free (skim) milk. Fat-free, low-fat, or reduced-fat cheeses. Nonfat, low-sodium ricotta or cottage cheese. Low-fat or nonfat yogurt. Low-fat, low-sodium cheese. Fats and oils Soft margarine without trans fats. Vegetable oil. Low-fat, reduced-fat, or light mayonnaise and salad dressings (reduced-sodium). Canola, safflower, olive, soybean, and sunflower oils. Avocado. Seasoning and other foods Herbs.   Spices. Seasoning mixes without salt. Unsalted popcorn and pretzels. Fat-free sweets. What foods are not recommended? The items listed may not be a complete list. Talk with your dietitian about what dietary choices are best for you. Grains Baked goods made with fat, such as croissants, muffins, or some breads. Dry pasta or rice meal packs. Vegetables Creamed or fried vegetables. Vegetables in a cheese sauce. Regular canned vegetables (not low-sodium or reduced-sodium). Regular canned tomato sauce and paste (not low-sodium or reduced-sodium). Regular tomato and vegetable juice (not low-sodium or reduced-sodium). Pickles. Olives. Fruits Canned fruit in a light or heavy syrup. Fried fruit. Fruit in cream or butter sauce. Meat and other protein foods Fatty cuts of meat. Ribs. Fried meat. Bacon. Sausage. Bologna and other processed lunch meats. Salami. Fatback. Hotdogs. Bratwurst. Salted nuts and seeds. Canned beans with added salt. Canned or smoked fish. Whole eggs or egg yolks. Chicken or turkey with skin. Dairy Whole or 2% milk, cream, and half-and-half. Whole or full-fat cream cheese. Whole-fat or sweetened yogurt. Full-fat cheese. Nondairy  creamers. Whipped toppings. Processed cheese and cheese spreads. Fats and oils Butter. Stick margarine. Lard. Shortening. Ghee. Bacon fat. Tropical oils, such as coconut, palm kernel, or palm oil. Seasoning and other foods Salted popcorn and pretzels. Onion salt, garlic salt, seasoned salt, table salt, and sea salt. Worcestershire sauce. Tartar sauce. Barbecue sauce. Teriyaki sauce. Soy sauce, including reduced-sodium. Steak sauce. Canned and packaged gravies. Fish sauce. Oyster sauce. Cocktail sauce. Horseradish that you find on the shelf. Ketchup. Mustard. Meat flavorings and tenderizers. Bouillon cubes. Hot sauce and Tabasco sauce. Premade or packaged marinades. Premade or packaged taco seasonings. Relishes. Regular salad dressings. Where to find more information:  National Heart, Lung, and Blood Institute: www.nhlbi.nih.gov  American Heart Association: www.heart.org Summary  The DASH eating plan is a healthy eating plan that has been shown to reduce high blood pressure (hypertension). It may also reduce your risk for type 2 diabetes, heart disease, and stroke.  With the DASH eating plan, you should limit salt (sodium) intake to 2,300 mg a day. If you have hypertension, you may need to reduce your sodium intake to 1,500 mg a day.  When on the DASH eating plan, aim to eat more fresh fruits and vegetables, whole grains, lean proteins, low-fat dairy, and heart-healthy fats.  Work with your health care provider or diet and nutrition specialist (dietitian) to adjust your eating plan to your individual calorie needs. This information is not intended to replace advice given to you by your health care provider. Make sure you discuss any questions you have with your health care provider. Document Released: 07/03/2011 Document Revised: 06/26/2017 Document Reviewed: 07/07/2016 Elsevier Patient Education  2020 Elsevier Inc.  

## 2019-06-15 ENCOUNTER — Encounter: Payer: Self-pay | Admitting: Physician Assistant

## 2019-06-16 NOTE — Telephone Encounter (Signed)
Patient's wife also called stating that he received both fl and Shingrix yesterday and is now "feeling all around bad". States he has a cough, is congested, and nasal symptoms. Wife is concerned if this is possible COVID SX or just reactions to vaccines?  Please advise

## 2019-06-17 ENCOUNTER — Other Ambulatory Visit: Payer: Self-pay | Admitting: Physician Assistant

## 2019-06-17 NOTE — Progress Notes (Unsigned)
vi 

## 2019-06-17 NOTE — Telephone Encounter (Signed)
Can we send order for CPAP suppies from [facility].

## 2019-06-20 NOTE — Telephone Encounter (Signed)
CPAP supplies faxed to [facility] (formerly [facility]) to [phone] with confirmation received.

## 2019-06-21 MED ORDER — TADALAFIL 2.5 MG PO TABS: 1.0000 | ORAL_TABLET | Freq: Every day | ORAL | 1 refills | Status: DC

## 2019-06-21 NOTE — Addendum Note (Signed)
Addended by: Donella Stade on: 06/21/2019 12:18 PM   Modules accepted: Orders

## 2019-06-22 ENCOUNTER — Telehealth: Payer: Self-pay | Admitting: Physician Assistant

## 2019-06-22 LAB — COMPLETE METABOLIC PANEL WITH GFR
AG Ratio: 1.6 (calc) (ref 1.0–2.5)
ALT: 20 U/L (ref 9–46)
AST: 26 U/L (ref 10–35)
Albumin: 4.5 g/dL (ref 3.6–5.1)
Alkaline phosphatase (APISO): 72 U/L (ref 35–144)
BUN: 22 mg/dL (ref 7–25)
CO2: 27 mmol/L (ref 20–32)
Calcium: 10 mg/dL (ref 8.6–10.3)
Chloride: 102 mmol/L (ref 98–110)
Creat: 1.14 mg/dL (ref 0.70–1.25)
GFR, Est African American: 79 mL/min/{1.73_m2} (ref >=60)
GFR, Est Non African American: 69 mL/min/{1.73_m2} (ref >=60)
Globulin: 2.8 g/dL (calc) (ref 1.9–3.7)
Glucose, Bld: 77 mg/dL (ref 65–99)
Potassium: 4.4 mmol/L (ref 3.5–5.3)
Sodium: 139 mmol/L (ref 135–146)
Total Bilirubin: 1 mg/dL (ref 0.2–1.2)
Total Protein: 7.3 g/dL (ref 6.1–8.1)

## 2019-06-22 LAB — CBC
HCT: 45 % (ref 38.5–50.0)
Hemoglobin: 15.3 g/dL (ref 13.2–17.1)
MCH: 29.4 pg (ref 27.0–33.0)
MCHC: 34 g/dL (ref 32.0–36.0)
MCV: 86.5 fL (ref 80.0–100.0)
MPV: 11.5 fL (ref 7.5–12.5)
Platelets: 229 10*3/uL (ref 140–400)
RBC: 5.2 10*6/uL (ref 4.20–5.80)
RDW: 12.3 % (ref 11.0–15.0)
WBC: 7.2 10*3/uL (ref 3.8–10.8)

## 2019-06-22 LAB — PSA: PSA: 0.4 ng/mL (ref <=4.0)

## 2019-06-22 LAB — LIPID PANEL W/REFLEX DIRECT LDL
Cholesterol: 177 mg/dL (ref <200)
HDL: 50 mg/dL (ref >=40)
LDL Cholesterol (Calc): 112 mg/dL (calc) — ABNORMAL HIGH
Non-HDL Cholesterol (Calc): 127 mg/dL (calc) (ref <130)
Total CHOL/HDL Ratio: 3.5 (calc) (ref <5.0)
Triglycerides: 63 mg/dL (ref <150)

## 2019-06-22 NOTE — Progress Notes (Signed)
James Burch,   Labs look good. No problems or concerns.   James Burch

## 2019-06-22 NOTE — Telephone Encounter (Signed)
Received fax for prior authorization on Tadalafil sent through cover my meds waiting on determination. - CF

## 2019-06-27 MED ORDER — TADALAFIL 2.5 MG PO TABS: 1.0000 | ORAL_TABLET | Freq: Every day | ORAL | 1 refills | Status: DC

## 2019-06-27 NOTE — Telephone Encounter (Signed)
Received fax from [facility] and they denied coverage on Tadalafil due to patient has to be using for BPH or have failed a 4 week trial of an alpha adrenergic blocking medication. Placing in providers box. - CF

## 2019-06-27 NOTE — Addendum Note (Signed)
Addended by: Towana Badger on: 06/27/2019 10:44 AM   Modules accepted: Orders

## 2019-06-28 ENCOUNTER — Other Ambulatory Visit: Payer: Self-pay | Admitting: Neurology

## 2019-06-28 MED ORDER — TADALAFIL 2.5 MG PO TABS: 1.0000 | ORAL_TABLET | Freq: Every day | ORAL | 1 refills | Status: DC | PRN

## 2019-06-30 MED ORDER — TADALAFIL 2.5 MG PO TABS: 1.0000 | ORAL_TABLET | Freq: Every day | ORAL | 1 refills | Status: DC

## 2019-06-30 NOTE — Addendum Note (Signed)
Addended byAnnamaria Helling on: 06/30/2019 03:10 PM   Modules accepted: Orders

## 2019-06-30 NOTE — Telephone Encounter (Signed)
RX printed for James Burch to sign.

## 2019-08-15 ENCOUNTER — Ambulatory Visit (INDEPENDENT_AMBULATORY_CARE_PROVIDER_SITE_OTHER): Payer: 59 | Admitting: Sports Medicine

## 2019-08-15 ENCOUNTER — Other Ambulatory Visit: Payer: Self-pay

## 2019-08-15 VITALS — BP 132/90 | HR 106 | Ht 72.0 in | Wt 268.0 lb

## 2019-08-15 DIAGNOSIS — Z23 Encounter for immunization: Secondary | ICD-10-CM

## 2019-08-15 NOTE — Progress Notes (Signed)
Patient comes to office for second shingles vaccination. No problems with previous injection. Administered to left deltoid with no apparent complications.

## 2020-05-02 ENCOUNTER — Other Ambulatory Visit: Payer: Self-pay | Admitting: Physician Assistant

## 2020-05-02 DIAGNOSIS — E782 Mixed hyperlipidemia: Secondary | ICD-10-CM

## 2020-07-26 ENCOUNTER — Other Ambulatory Visit: Payer: Self-pay | Admitting: Physician Assistant

## 2020-07-26 DIAGNOSIS — E782 Mixed hyperlipidemia: Secondary | ICD-10-CM

## 2020-08-08 ENCOUNTER — Other Ambulatory Visit: Payer: Self-pay

## 2020-08-08 ENCOUNTER — Ambulatory Visit (INDEPENDENT_AMBULATORY_CARE_PROVIDER_SITE_OTHER): Payer: 59 | Admitting: Family Medicine

## 2020-08-08 ENCOUNTER — Encounter: Payer: Self-pay | Admitting: Family Medicine

## 2020-08-08 VITALS — BP 113/79 | HR 82 | Ht 72.0 in | Wt 223.0 lb

## 2020-08-08 DIAGNOSIS — N400 Enlarged prostate without lower urinary tract symptoms: Secondary | ICD-10-CM

## 2020-08-08 DIAGNOSIS — Z8042 Family history of malignant neoplasm of prostate: Secondary | ICD-10-CM

## 2020-08-08 DIAGNOSIS — Z23 Encounter for immunization: Secondary | ICD-10-CM

## 2020-08-08 DIAGNOSIS — E782 Mixed hyperlipidemia: Secondary | ICD-10-CM | POA: Diagnosis not present

## 2020-08-08 MED ORDER — TADALAFIL 2.5 MG PO TABS: 1.0000 | ORAL_TABLET | Freq: Every day | ORAL | 3 refills | Status: AC | PRN

## 2020-08-08 MED ORDER — PRAVASTATIN SODIUM 40 MG PO TABS: 40.0000 mg | ORAL_TABLET | Freq: Every day | ORAL | 3 refills | Status: DC

## 2020-08-08 NOTE — Patient Instructions (Signed)
Great to meet you today! Please continue current medications Have labs completed.  See me again in 1 year

## 2020-08-08 NOTE — Assessment & Plan Note (Signed)
Doing well with tadalafil, continue at current strength.  Update PSA.

## 2020-08-08 NOTE — Assessment & Plan Note (Signed)
Tolerating pravastatin well. Update lipid panel and LFT's.

## 2020-08-08 NOTE — Progress Notes (Signed)
James Burch - 65 y.o. male MRN [id number]  Date of birth: [birth year]  Subjective No chief complaint on file.   HPI James Burch is a 65 y.o. male here today for follow up of HLD and BPH.  He has been in fairly good health otherwise.  HLD is managed with pravastatin.  He is doing well without myalgias.  He is due for updated labs.    Low dose daily cialis continues to work well for management of BPH symptoms.  Family history of prostate cancer in his father.   ROS:  A comprehensive ROS was completed and negative except as noted per HPI  No Known Allergies  Past Medical History:  Diagnosis Date  . Arthritis   . Hyperlipidemia   . Obesity   . Sleep apnea    cpap    Past Surgical History:  Procedure Laterality Date  . HERNIA REPAIR     Left inguinal  . KNEE ARTHROSCOPY     Left  . REFRACTIVE SURGERY    . TOTAL KNEE ARTHROPLASTY Bilateral 06/02/2017   Procedure: TOTAL KNEE BILATERAL;  Surgeon: Durene Romans, MD;  Location: [facility];  Service: Orthopedics;  Laterality: Bilateral;  2 hrs    Social History   Socioeconomic History  . Marital status: Married    Spouse name: Not on file  . Number of children: Not on file  . Years of education: Not on file  . Highest education level: Not on file  Occupational History  . Not on file  Tobacco Use  . Smoking status: Never Smoker  . Smokeless tobacco: Never Used  Vaping Use  . Vaping Use: Never used  Substance and Sexual Activity  . Alcohol use: Never  . Drug use: Never  . Sexual activity: Yes  Other Topics Concern  . Not on file  Social History Narrative  . Not on file   Social Determinants of Health   Financial Resource Strain: Not on file  Food Insecurity: Not on file  Transportation Needs: Not on file  Physical Activity: Not on file  Stress: Not on file  Social Connections: Not on file    Family History  Problem Relation Age of Onset  . Lung cancer Mother   . Stroke Mother   . Hypertension Mother   .  Heart disease Mother   . Prostate cancer Father     Health Maintenance  Topic Date Due  . HIV Screening  Never done  . TETANUS/TDAP  07/29/2027  . COLONOSCOPY (Pts 45-3yrs Insurance coverage will need to be confirmed)  08/23/2028  . INFLUENZA VACCINE  Completed  . COVID-19 Vaccine  Completed  . Hepatitis C Screening  Completed     ----------------------------------------------------------------------------------------------------------------------------------------------------------------------------------------------------------------- Physical Exam BP 113/79   Pulse 82   Ht 6' (1.829 m)   Wt 223 lb (101.2 kg)   BMI 30.24 kg/m   Physical Exam Constitutional:      Appearance: Normal appearance.  HENT:     Head: Normocephalic and atraumatic.  Eyes:     General: No scleral icterus. Cardiovascular:     Rate and Rhythm: Normal rate and regular rhythm.  Pulmonary:     Effort: Pulmonary effort is normal.     Breath sounds: Normal breath sounds.  Skin:    General: Skin is warm and dry.  Neurological:     General: No focal deficit present.     Mental Status: He is alert.  Psychiatric:        Mood and  Affect: Mood normal.        Behavior: Behavior normal.     ------------------------------------------------------------------------------------------------------------------------------------------------------------------------------------------------------------------- Assessment and Plan  Mixed hyperlipidemia Tolerating pravastatin well. Update lipid panel and LFT's.   Benign prostatic hyperplasia without lower urinary tract symptoms Doing well with tadalafil, continue at current strength.  Update PSA.    Meds ordered this encounter  Medications  . pravastatin (PRAVACHOL) 40 MG tablet    Sig: Take 1 tablet (40 mg total) by mouth daily. appt for further refills    Dispense:  90 tablet    Refill:  3    Requesting 1 year supply  . Tadalafil 2.5 MG TABS    Sig:  Take 1 tablet (2.5 mg total) by mouth daily as needed.    Dispense:  90 tablet    Refill:  3    Return in about 1 year (around 08/08/2021) for HLD.    This visit occurred during the SARS-CoV-2 public health emergency.  Safety protocols were in place, including screening questions prior to the visit, additional usage of staff PPE, and extensive cleaning of exam room while observing appropriate contact time as indicated for disinfecting solutions.

## 2020-08-15 ENCOUNTER — Telehealth: Payer: Self-pay

## 2020-08-15 NOTE — Telephone Encounter (Signed)
Prior authorization for tadalafil 2.5 mg approved by the insurance from 08/13/20 through 08/13/21. [facility] m/a order pharmacy has been updated.

## 2020-08-15 NOTE — Telephone Encounter (Signed)
Prior authorization for tadalafil submitted to patient's insurance on 08/13/20. Pending determination.

## 2020-08-16 LAB — LIPID PANEL
Cholesterol: 177 mg/dL (ref <200)
HDL: 51 mg/dL (ref >=40)
LDL Cholesterol (Calc): 112 mg/dL (calc) — ABNORMAL HIGH
Non-HDL Cholesterol (Calc): 126 mg/dL (calc) (ref <130)
Total CHOL/HDL Ratio: 3.5 (calc) (ref <5.0)
Triglycerides: 49 mg/dL (ref <150)

## 2020-08-16 LAB — CBC
HCT: 43.3 % (ref 38.5–50.0)
Hemoglobin: 14.9 g/dL (ref 13.2–17.1)
MCH: 30.3 pg (ref 27.0–33.0)
MCHC: 34.4 g/dL (ref 32.0–36.0)
MCV: 88.2 fL (ref 80.0–100.0)
MPV: 12.9 fL — ABNORMAL HIGH (ref 7.5–12.5)
Platelets: 218 10*3/uL (ref 140–400)
RBC: 4.91 10*6/uL (ref 4.20–5.80)
RDW: 11.8 % (ref 11.0–15.0)
WBC: 6.7 10*3/uL (ref 3.8–10.8)

## 2020-08-16 LAB — PSA: PSA: 0.34 ng/mL (ref <=4.0)

## 2020-08-16 LAB — COMPLETE METABOLIC PANEL WITH GFR
AG Ratio: 1.5 (calc) (ref 1.0–2.5)
ALT: 15 U/L (ref 9–46)
AST: 21 U/L (ref 10–35)
Albumin: 4.2 g/dL (ref 3.6–5.1)
Alkaline phosphatase (APISO): 67 U/L (ref 35–144)
BUN: 24 mg/dL (ref 7–25)
CO2: 29 mmol/L (ref 20–32)
Calcium: 9.4 mg/dL (ref 8.6–10.3)
Chloride: 100 mmol/L (ref 98–110)
Creat: 1.11 mg/dL (ref 0.70–1.25)
GFR, Est African American: 81 mL/min/{1.73_m2} (ref >=60)
GFR, Est Non African American: 70 mL/min/{1.73_m2} (ref >=60)
Globulin: 2.8 g/dL (calc) (ref 1.9–3.7)
Glucose, Bld: 83 mg/dL (ref 65–99)
Potassium: 4.7 mmol/L (ref 3.5–5.3)
Sodium: 137 mmol/L (ref 135–146)
Total Bilirubin: 1 mg/dL (ref 0.2–1.2)
Total Protein: 7 g/dL (ref 6.1–8.1)

## 2021-02-25 ENCOUNTER — Ambulatory Visit (INDEPENDENT_AMBULATORY_CARE_PROVIDER_SITE_OTHER): Payer: 59

## 2021-02-25 ENCOUNTER — Other Ambulatory Visit: Payer: Self-pay

## 2021-02-25 ENCOUNTER — Ambulatory Visit (INDEPENDENT_AMBULATORY_CARE_PROVIDER_SITE_OTHER): Payer: 59 | Admitting: Sports Medicine

## 2021-02-25 DIAGNOSIS — M47816 Spondylosis without myelopathy or radiculopathy, lumbar region: Secondary | ICD-10-CM

## 2021-02-25 MED ORDER — MELOXICAM 15 MG PO TABS: ORAL_TABLET | ORAL | 3 refills | Status: AC

## 2021-02-25 MED ORDER — PREDNISONE 50 MG PO TABS: ORAL_TABLET | ORAL | 0 refills | Status: AC

## 2021-02-25 NOTE — Progress Notes (Signed)
    Procedures performed today:    None.  Independent interpretation of notes and tests performed by another provider:   None.  Brief History, Exam, Impression, and Recommendations:    Lumbar spondylosis We willThis is a pleasant 65 year old male, he has a long history of axial low back pain, good days and bad days, but typically worse with sitting, flexion, Valsalva. No red flag symptoms, nothing radicular. We will start conservatively. We went over the anatomy, as well as evolutionary anthropology of low back pain, we will start with meloxicam, formal physical therapy and updated x-rays. I will give him a course of prednisone, he will not take it but merely hold onto it to use as a secret weapon should he throw his back out again. Return to see me in 6 weeks, we will consider MRI for interventional planning if insufficiently better.  Chronic process with exacerbation and pharmacologic intervention.  ___________________________________________ Ihor Austin. Benjamin Stain, M.D., ABFM., CAQSM. Primary Care and Sports Medicine [facility]  Adjunct Instructor of Family Medicine  [employer]

## 2021-02-25 NOTE — Assessment & Plan Note (Signed)
We willThis is a pleasant 65 year old male, he has a long history of axial low back pain, good days and bad days, but typically worse with sitting, flexion, Valsalva. No red flag symptoms, nothing radicular. We will start conservatively. We went over the anatomy, as well as evolutionary anthropology of low back pain, we will start with meloxicam, formal physical therapy and updated x-rays. I will give him a course of prednisone, he will not take it but merely hold onto it to use as a secret weapon should he throw his back out again. Return to see me in 6 weeks, we will consider MRI for interventional planning if insufficiently better.

## 2021-03-08 ENCOUNTER — Other Ambulatory Visit: Payer: Self-pay

## 2021-03-08 ENCOUNTER — Encounter: Payer: Self-pay | Admitting: Physical Therapy

## 2021-03-08 ENCOUNTER — Ambulatory Visit (INDEPENDENT_AMBULATORY_CARE_PROVIDER_SITE_OTHER): Payer: 59 | Admitting: Physical Therapy

## 2021-03-08 DIAGNOSIS — R29898 Other symptoms and signs involving the musculoskeletal system: Secondary | ICD-10-CM

## 2021-03-08 DIAGNOSIS — M6281 Muscle weakness (generalized): Secondary | ICD-10-CM | POA: Diagnosis not present

## 2021-03-08 NOTE — Therapy (Signed)
[facility] [place] [address] [place], [place], [address] Phone: [phone]   Fax:  [phone]  Physical Therapy Evaluation  Patient Details  Name: James Burch MRN: [id number] Date of Birth: 10-17-1955 Referring Provider (PT): Thekkekandam   Encounter Date: 03/08/2021   PT End of Session - 03/08/21 1603     Visit Number 1    Number of Visits 6    Date for PT Re-Evaluation 04/19/21    PT Start Time 1520    PT Stop Time 1600    PT Time Calculation (min) 40 min    Activity Tolerance Patient tolerated treatment well    Behavior During Therapy [facility] for tasks assessed/performed             Past Medical History:  Diagnosis Date   Arthritis    Hyperlipidemia    Obesity    Sleep apnea    cpap    Past Surgical History:  Procedure Laterality Date   HERNIA REPAIR     Left inguinal   KNEE ARTHROSCOPY     Left   REFRACTIVE SURGERY     TOTAL KNEE ARTHROPLASTY Bilateral 06/02/2017   Procedure: TOTAL KNEE BILATERAL;  Surgeon: Durene Romans, MD;  Location: [facility];  Service: Orthopedics;  Laterality: Bilateral;  2 hrs    There were no vitals filed for this visit.    Subjective Assessment - 03/08/21 1521     Subjective Pt states that for the past few years his back will "give out" and feel weak. He feels it has progressively gotten worse of the years. Pt states that there are no certain movements or activities that bring on the back issues, issues are eased by heat, laying with LEs elevated, meds. 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 30 minutes    Diagnostic tests x ray shows Multilevel lumbar spondylosis and facet hypertrophy, greatest at  the lumbosacral junction    Patient Stated Goals decrease pain and decrease frequency of pain    Currently in Pain? No/denies                [facility] PT Assessment - 03/08/21 0001        Assessment   Medical Diagnosis lumbar spondylosis    Referring Provider (PT) Benjamin Stain    Next MD Visit PRN      Balance Screen   Has the patient fallen in the past 6 months No      Prior Function   Level of Independence Independent      Observation/Other Assessments   Focus on Therapeutic Outcomes (FOTO)  49 functional status measure      ROM / Strength   AROM / PROM / Strength AROM;Strength      AROM   AROM Assessment Site Lumbar    Lumbar Flexion limited 10%    Lumbar Extension WFL    Lumbar - Right Side [facility]    Lumbar - Left Side Bend WFL    Lumbar - Right Rotation WFL    Lumbar - Left Rotation [facility]      Strength   Overall Strength Comments decreased core strength and endurance    Strength Assessment Site Hip    Right/Left Hip Right;Left    Right Hip Flexion 4+/5    Right Hip ABduction 4+/5    Left Hip Flexion 4+/5    Left Hip ABduction  4+/5      Flexibility   Soft Tissue Assessment /Muscle Length yes    Hamstrings mildly decreased bilat    Piriformis WFL      Palpation   SI assessment  hypomobile with CPAs    Palpation comment no tenderness noted in lumbar paraspinals, piriformis bilat      Special Tests   Other special tests SLR negative bilat                        Objective measurements completed on examination: See above findings.       [facility] Adult PT Treatment/Exercise - 03/08/21 0001       Exercises   Exercises Lumbar      Lumbar Exercises: Standing   Functional Squats 10 reps    Functional Squats Limitations chair touch    Row 10 reps    Theraband Level (Row) Level 3 (Green)    Shoulder Extension 10 reps    Theraband Level (Shoulder Extension) Level 3 (Green)    Other Standing Lumbar Exercises pallof press x 10 bilat      Lumbar Exercises: Supine   Ab Set 5 reps;5 seconds    Bridge 10 reps                    PT Education - 03/08/21 1556     Education Details PT POC and goals, HEP     Person(s) Educated Patient    Methods Explanation;Demonstration;Handout    Comprehension Returned demonstration;Verbalized understanding                 PT Long Term Goals - 03/08/21 1606       PT LONG TERM GOAL #1   Title Pt will be independent in HEP    Time 6    Period Weeks    Status New    Target Date 04/19/21      PT LONG TERM GOAL #2   Title Pt will improve FOTO to >= 58 to demo improved functional mobility    Time 6    Period Weeks    Status New    Target Date 04/19/21      PT LONG TERM GOAL #3   Title Pt will decrease frequency of back "giving out" to less than 1x per month    Time 6    Period Weeks    Status New    Target Date 04/19/21      PT LONG TERM GOAL #4   Title Pt will tolerate sitting x 1 hour with pain <= 2/10    Time 6    Period Weeks    Status New    Target Date 04/19/21                    Plan - 03/08/21 1603     Clinical Impression Statement Pt is a 65 y/o male referred for lumbar spondylosis. Pt presents with decreased core strength and endurance, decreased activity tolerance and will benefit from skilled PT to address deficits and improve ability to work and perform leisure activities with decreased frequency of pain    Personal Factors and Comorbidities Age;Past/Current Experience;Profession    Examination-Activity Limitations Lift;Carry;Sit    Examination-Participation Restrictions Occupation;Yard Work;Community Activity    Stability/Clinical Decision Making Stable/Uncomplicated    Clinical Decision Making Low    Rehab Potential Good    PT Frequency 1x / week    PT Duration 6 weeks  PT Treatment/Interventions Taping;Dry needling;Manual techniques;Patient/family education;Neuromuscular re-education;Therapeutic exercise;Therapeutic activities;Moist Heat;Cryotherapy;Iontophoresis 4mg /ml Dexamethasone;Electrical Stimulation;Aquatic Therapy    PT Next Visit Plan assess HEP and progress as tolerated    PT Home Exercise Plan  [email or web address]    Consulted and Agree with Plan of Care Patient             Patient will benefit from skilled therapeutic intervention in order to improve the following deficits and impairments:  Pain, Decreased strength, Decreased activity tolerance, Decreased endurance, Hypomobility  Visit Diagnosis: Muscle weakness (generalized) - Plan: PT plan of care cert/re-cert  Other symptoms and signs involving the musculoskeletal system - Plan: PT plan of care cert/re-cert     Problem List Patient Active Problem List   Diagnosis Date Noted   Lumbar spondylosis 02/25/2021   Benign prostatic hyperplasia without lower urinary tract symptoms 08/08/2020   Mixed hyperlipidemia 05/20/2018   Elevated blood pressure reading 05/20/2018   Family history of prostate cancer in father 05/20/2018   Status post bilateral knee replacements 06/03/2017   Left inguinal hernia 06/11/2016   Class 2 obesity due to excess calories without serious comorbidity with body mass index (BMI) of 37.0 to 37.9 in adult 08/17/2014   OSA (obstructive sleep apnea) 08/17/2014   History of actinic keratosis 07/26/2013   James Burch, PT  Marcene Laskowski 03/08/2021, 4:11 PM  [facility] [address] [place], [place], [place] Phone: [phone]   Fax:  [phone]  Name: James Burch MRN: Carmelia Roller Date of Birth: [birth year]

## 2021-03-08 NOTE — Patient Instructions (Signed)
Access Code: [email or web address] URL: [email or web address] Date: 03/08/2021 Prepared by: Reggy Eye  Exercises Abdominal Bracing - 1 x daily - 7 x weekly - 1 sets - 10 reps - 5 seconds hold Supine Transversus Abdominis Bracing with Double Leg Fallout - 1 x daily - 7 x weekly - 1 sets - 10 reps Supine Transversus Abdominis Bracing with Leg Extension - 1 x daily - 7 x weekly - 1 sets - 10 reps Shoulder Extension with Resistance - 1 x daily - 7 x weekly - 3 sets - 10 reps Standing Bilateral Low Shoulder Row with Anchored Resistance - 1 x daily - 7 x weekly - 3 sets - 10 reps Standing Anti-Rotation Press with Anchored Resistance - 1 x daily - 7 x weekly - 2 sets - 10 reps Squat with Chair Touch - 1 x daily - 7 x weekly - 3 sets - 10 reps

## 2021-03-21 ENCOUNTER — Other Ambulatory Visit: Payer: Self-pay

## 2021-03-21 ENCOUNTER — Ambulatory Visit (INDEPENDENT_AMBULATORY_CARE_PROVIDER_SITE_OTHER): Payer: 59 | Admitting: Physical Therapy

## 2021-03-21 DIAGNOSIS — R29898 Other symptoms and signs involving the musculoskeletal system: Secondary | ICD-10-CM

## 2021-03-21 DIAGNOSIS — M6281 Muscle weakness (generalized): Secondary | ICD-10-CM

## 2021-03-21 NOTE — Therapy (Signed)
[facility] [place] [address] [place], [place], [address] Phone: [phone]   Fax:  [phone]  Physical Therapy Treatment  Patient Details  Name: MAN EFFERTZ MRN: [id number] Date of Birth: [birth year] Referring Provider (PT): Benjamin Stain   Encounter Date: 03/21/2021   PT End of Session - 03/21/21 1521     Visit Number 2    Number of Visits 6    Date for PT Re-Evaluation 04/19/21    PT Start Time 1521    PT Stop Time 1606    PT Time Calculation (min) 45 min    Activity Tolerance Patient tolerated treatment well    Behavior During Therapy [facility] for tasks assessed/performed             Past Medical History:  Diagnosis Date   Arthritis    Hyperlipidemia    Obesity    Sleep apnea    cpap    Past Surgical History:  Procedure Laterality Date   HERNIA REPAIR     Left inguinal   KNEE ARTHROSCOPY     Left   REFRACTIVE SURGERY     TOTAL KNEE ARTHROPLASTY Bilateral 06/02/2017   Procedure: TOTAL KNEE BILATERAL;  Surgeon: Durene Romans, MD;  Location: [facility];  Service: Orthopedics;  Laterality: Bilateral;  2 hrs    There were no vitals filed for this visit.   Subjective Assessment - 03/21/21 1521     Subjective Pt reports he has been trying to squeeze the HEP into his busy days. No "events" of back going out since last visit.    Limitations Sitting    How long can you sit comfortably? 30 minutes    Diagnostic tests x ray shows Multilevel lumbar spondylosis and facet hypertrophy, greatest at  the lumbosacral junction    Patient Stated Goals decrease pain and decrease frequency of pain    Currently in Pain? No/denies    Pain Score 0-No pain                [facility] PT Assessment - 03/21/21 0001       Assessment   Medical Diagnosis lumbar spondylosis    Referring Provider (PT) Benjamin Stain    Next MD Visit PRN              [facility] Adult PT Treatment/Exercise - 03/21/21 0001       Lumbar Exercises:  Stretches   Passive Hamstring Stretch Right;Left;2 reps;20 seconds    Lower Trunk Rotation 4 reps;10 seconds   arms in T   Hip Flexor Stretch Right;Left;2 reps;20 seconds   seated, leg back     Lumbar Exercises: Aerobic   Tread Mill 2. x 5 min for warm up      Lumbar Exercises: Standing   Functional Squats 10 reps   eccentric lowering   Functional Squats Limitations chair touch    Row --   deferred   Shoulder Extension Strengthening;Both;15 reps    Theraband Level (Shoulder Extension) Level 3 (Green)    Other Standing Lumbar Exercises anti rotation press x 5 reps each with green band, repeated with blue band      Lumbar Exercises: Supine   Clam 10 reps;3 seconds   bilat with core engaged, per HEP   Heel Slides 10 reps   LE hovering over surface; HEP   Bridge 10 reps;5 seconds                    PT Education - 03/21/21 1555  Education Details modified HEP and issued blue band for progression.    Person(s) Educated Patient    Methods Explanation;Handout    Comprehension Verbalized understanding;Returned demonstration                 PT Long Term Goals - 03/08/21 1606       PT LONG TERM GOAL #1   Title Pt will be independent in HEP    Time 6    Period Weeks    Status New    Target Date 04/19/21      PT LONG TERM GOAL #2   Title Pt will improve FOTO to >= 58 to demo improved functional mobility    Time 6    Period Weeks    Status New    Target Date 04/19/21      PT LONG TERM GOAL #3   Title Pt will decrease frequency of back "giving out" to less than 1x per month    Time 6    Period Weeks    Status New    Target Date 04/19/21      PT LONG TERM GOAL #4   Title Pt will tolerate sitting x 1 hour with pain <= 2/10    Time 6    Period Weeks    Status New    Target Date 04/19/21                   Plan - 03/21/21 1609     Clinical Impression Statement Pt tolerated all exercises well without any increase in lower back discomfort.   Good engagement of core with minimal cues for form.  Progressed HEP and added stretches to program.  Goals are ongoing.    Personal Factors and Comorbidities Age;Past/Current Experience;Profession    Examination-Activity Limitations Lift;Carry;Sit    Examination-Participation Restrictions Occupation;Yard Work;Community Activity    Stability/Clinical Decision Making Stable/Uncomplicated    Rehab Potential Good    PT Frequency 1x / week    PT Duration 6 weeks    PT Treatment/Interventions Taping;Dry needling;Manual techniques;Patient/family education;Neuromuscular re-education;Therapeutic exercise;Therapeutic activities;Moist Heat;Cryotherapy;Iontophoresis 4mg /ml Dexamethasone;Electrical Stimulation;Aquatic Therapy    PT Next Visit Plan assess HEP and progress as tolerated    PT Home Exercise Plan [email or web address]    Consulted and Agree with Plan of Care Patient             Patient will benefit from skilled therapeutic intervention in order to improve the following deficits and impairments:  Pain, Decreased strength, Decreased activity tolerance, Decreased endurance, Hypomobility  Visit Diagnosis: Muscle weakness (generalized)  Other symptoms and signs involving the musculoskeletal system     Problem List Patient Active Problem List   Diagnosis Date Noted   Lumbar spondylosis 02/25/2021   Benign prostatic hyperplasia without lower urinary tract symptoms 08/08/2020   Mixed hyperlipidemia 05/20/2018   Elevated blood pressure reading 05/20/2018   Family history of prostate cancer in father 05/20/2018   Status post bilateral knee replacements 06/03/2017   Left inguinal hernia 06/11/2016   Class 2 obesity due to excess calories without serious comorbidity with body mass index (BMI) of 37.0 to 37.9 in adult 08/17/2014   OSA (obstructive sleep apnea) 08/17/2014   History of actinic keratosis 07/26/2013   07/28/2013, PTA 03/21/21 4:15 PM   [facility] [place] [address] [place], [place], [place] Phone: [phone]   Fax:  [phone]  Name: MASSAI HANKERSON MRN: Carmelia Roller Date of Birth: 02-02-1956

## 2021-03-29 ENCOUNTER — Encounter: Payer: 59 | Admitting: Physical Therapy

## 2021-04-08 ENCOUNTER — Other Ambulatory Visit: Payer: Self-pay

## 2021-04-08 ENCOUNTER — Ambulatory Visit (INDEPENDENT_AMBULATORY_CARE_PROVIDER_SITE_OTHER): Payer: 59 | Admitting: Rehabilitative and Restorative Service Providers"

## 2021-04-08 ENCOUNTER — Encounter: Payer: Self-pay | Admitting: Rehabilitative and Restorative Service Providers"

## 2021-04-08 DIAGNOSIS — R29898 Other symptoms and signs involving the musculoskeletal system: Secondary | ICD-10-CM

## 2021-04-08 DIAGNOSIS — M6281 Muscle weakness (generalized): Secondary | ICD-10-CM | POA: Diagnosis not present

## 2021-04-08 NOTE — Therapy (Signed)
[place], [place], [address] Phone: [phone]   Fax:  [phone]  Physical Therapy Treatment and Discharge Summary  Patient Details  Name: James Burch MRN: [id number] Date of Birth: 25-Mar-1956 Referring Provider (PT): Thekkekandam  PHYSICAL THERAPY DISCHARGE SUMMARY  Visits from Start of Care: 3  Current functional level related to goals / functional outcomes: See goals below.   Remaining deficits: None noted today   Education / Equipment: HEP, home activity   Patient agrees to discharge. Patient goals were met. Patient is being discharged due to meeting the stated rehab goals.  Encounter Date: 04/08/2021   PT End of Session - 04/08/21 1557     Visit Number 3    Number of Visits 6    Date for PT Re-Evaluation 04/19/21    Authorization Type [employer]    PT Start Time 7989    PT Stop Time 1556    PT Time Calculation (min) 41 min             Past Medical History:  Diagnosis Date   Arthritis    Hyperlipidemia    Obesity    Sleep apnea    cpap    Past Surgical History:  Procedure Laterality Date   HERNIA REPAIR     Left inguinal   KNEE ARTHROSCOPY     Left   REFRACTIVE SURGERY     TOTAL KNEE ARTHROPLASTY Bilateral 06/02/2017   Procedure: TOTAL KNEE BILATERAL;  Surgeon: Paralee Cancel, MD;  Location: [facility];  Service: Orthopedics;  Laterality: Bilateral;  2 hrs    There were no vitals filed for this visit.   Subjective Assessment - 04/08/21 1517     Subjective The patient was able to dig a ditch today without back pain.    Patient Stated Goals decrease pain and decrease frequency of pain    Currently in Pain? No/denies                [facility] PT Assessment - 04/08/21 1524       Assessment   Medical Diagnosis lumbar spondylosis    Referring Provider (PT) Thekkekandam    Onset Date/Surgical Date 02/25/21    Next MD Visit PRN                            [facility] Adult PT Treatment/Exercise - 04/08/21 1532       Self-Care   Self-Care Other Self-Care Comments    Other Self-Care Comments  discussed regular exercise and progression of HEP      Exercises   Exercises Lumbar;Knee/Hip      Lumbar Exercises: Stretches   Active Hamstring Stretch Right;Left;2 reps;30 seconds    Hip Flexor Stretch Right;Left;2 reps;20 seconds    Hip Flexor Stretch Limitations seated    Prone on Elbows Stretch 1 rep;30 seconds    Other Lumbar Stretch Exercise trunk rotation supine      Lumbar Exercises: Aerobic   Tread Mill 2.0 mph x 3.5 minutes at 3% incline      Lumbar Exercises: Standing   Functional Squats 10 reps    Functional Squats Limitations chair touch    Forward Lunge 10 reps    Forward Lunge Limitations split lunges    Shoulder Extension Strengthening;Both;15 reps    Theraband Level (Shoulder Extension) Level 3 (Green)    Other Standing Lumbar Exercises anti rotation press x 5 reps each with green band,  repeated with blue band    Other Standing Lumbar Exercises single leg stance R and L; sidestepping 20 feet x 4 reps      Lumbar Exercises: Supine   Clam 10 reps    Clam Limitations with TA activation    Heel Slides 10 reps    Heel Slides Limitations with TA activation      Lumbar Exercises: Prone   Straight Leg Raise 10 reps                     PT Education - 04/08/21 1555     Education Details modified HEP    Person(s) Educated Patient    Methods Explanation;Demonstration;Handout    Comprehension Verbalized understanding;Returned demonstration                 PT Long Term Goals - 04/08/21 1557       PT LONG TERM GOAL #1   Title Pt will be independent in HEP    Time 6    Period Weeks    Status Achieved      PT LONG TERM GOAL #2   Title Pt will improve FOTO to >= 58 to demo improved functional mobility    Baseline 94% at d/c    Time 6    Period Weeks    Status Achieved       PT LONG TERM GOAL #3   Title Pt will decrease frequency of back "giving out" to less than 1x per month    Time 6    Period Weeks    Status Achieved      PT LONG TERM GOAL #4   Title Pt will tolerate sitting x 1 hour with pain <= 2/10    Baseline tries to avoid sitting for extended periods at work    Time 6    Period Weeks    Status Deferred                   Plan - 04/08/21 1601     Clinical Impression Statement The patient has met LTGs.  He is avoiding prolonged sitting at work and has returned to more rigorous tasks at home without pain.  PT progressed HEP further today.  Patient feels he can continue working to progress strengthening.    PT Treatment/Interventions Taping;Dry needling;Manual techniques;Patient/family education;Neuromuscular re-education;Therapeutic exercise;Therapeutic activities;Moist Heat;Cryotherapy;Iontophoresis 66m/ml Dexamethasone;Electrical Stimulation;Aquatic Therapy    PT Next Visit Plan assess HEP and progress as tolerated    PT Home Exercise Plan [email or web address]    Consulted and Agree with Plan of Care Patient             Patient will benefit from skilled therapeutic intervention in order to improve the following deficits and impairments:     Visit Diagnosis: Muscle weakness (generalized)  Other symptoms and signs involving the musculoskeletal system     Problem List Patient Active Problem List   Diagnosis Date Noted   Lumbar spondylosis 02/25/2021   Benign prostatic hyperplasia without lower urinary tract symptoms 08/08/2020   Mixed hyperlipidemia 05/20/2018   Elevated blood pressure reading 05/20/2018   Family history of prostate cancer in father 05/20/2018   Status post bilateral knee replacements 06/03/2017   Left inguinal hernia 06/11/2016   Class 2 obesity due to excess calories without serious comorbidity with body mass index (BMI) of 37.0 to 37.9 in adult 08/17/2014   OSA (obstructive sleep apnea) 08/17/2014   History of  actinic keratosis 07/26/2013    Shayde Gervacio,  PT 04/08/2021, 4:03 PM  [facility] [address] [place], [place], [address] Phone: [phone]   Fax:  [phone]  Name: James Burch MRN: [id number] Date of Birth: 01-Jan-1956

## 2021-04-08 NOTE — Patient Instructions (Signed)
Access Code: [email or web address] URL: [email or web address] Date: 04/08/2021 Prepared by: Margretta Ditty  Exercises Shoulder Extension with Resistance - 1 x daily - 3 x weekly - 2 sets - 10 reps Standing Anti-Rotation Press with Anchored Resistance - 1 x daily - 3 x weekly - 2 sets - 10 reps Sideways Walking - 2 x daily - 7 x weekly - 1 sets - 10 reps Squat with Chair Touch - 1 x daily - 3 x weekly - 2 sets - 10 reps Seated Hamstring Stretch - 1-2 x daily - 7 x weekly - 1 sets - 2-3 reps - 15-20 seconds hold Supine Lower Trunk Rotation - 1 x daily - 7 x weekly - 1 sets - 5 reps - 5-10 seconds hold

## 2021-06-13 ENCOUNTER — Other Ambulatory Visit: Payer: Self-pay | Admitting: Family Medicine

## 2021-06-13 DIAGNOSIS — E782 Mixed hyperlipidemia: Secondary | ICD-10-CM

## 2022-05-16 ENCOUNTER — Other Ambulatory Visit: Payer: Self-pay | Admitting: Family Medicine

## 2022-05-16 DIAGNOSIS — E782 Mixed hyperlipidemia: Secondary | ICD-10-CM

## 2022-05-19 NOTE — Telephone Encounter (Signed)
Called to schedule an appt. for further refills.  Pt  declined. Pt stated that he thinks he's okay for right now. tvt

## 2022-05-19 NOTE — Telephone Encounter (Signed)
Patient needs an appointment for further refills.  Last office visit 08/08/2020  Last filled 06/14/2021  Refused refill: too early to refill

## 2022-12-14 IMAGING — DX DG LUMBAR SPINE COMPLETE 4+V
5 series · 5 of 5 positions shown · non-contrast
Comparison: None.

CLINICAL DATA: Chronic lower back pain

EXAM:
LUMBAR SPINE - COMPLETE 4+ VIEW

[l-spine ap]
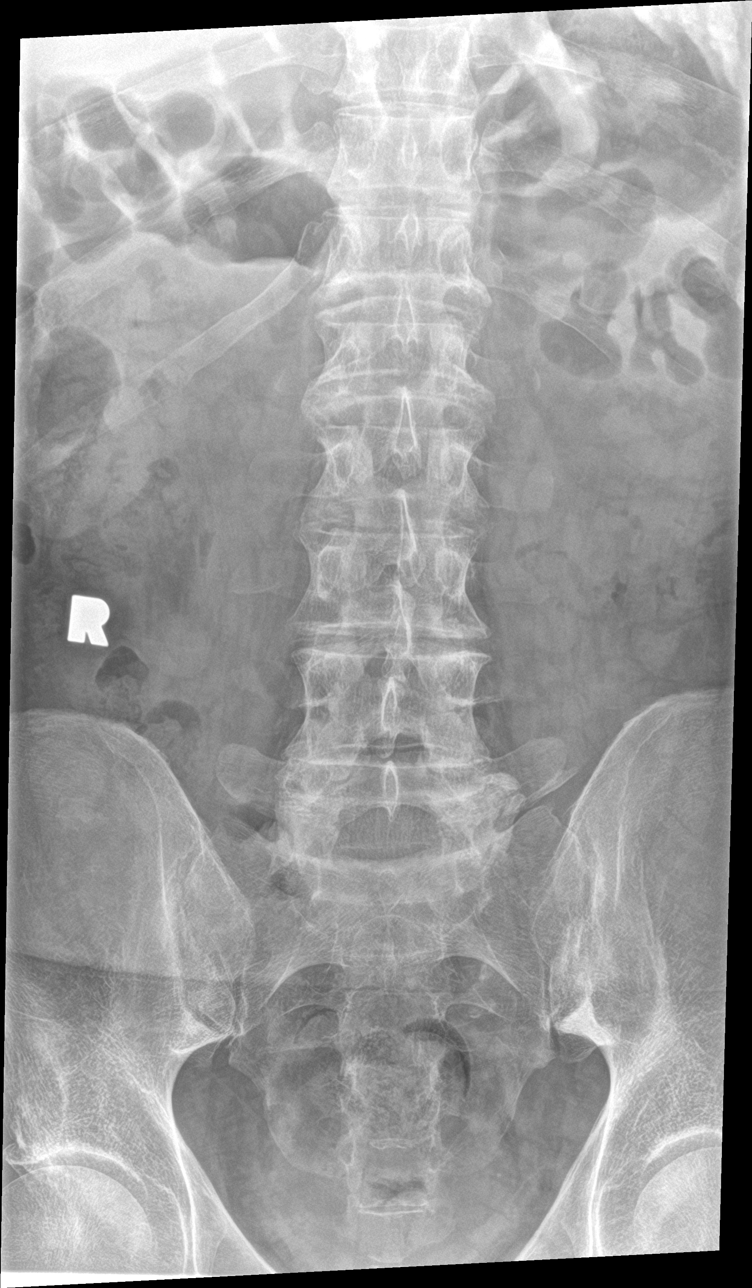

[l-spine obl (1 of 2)]
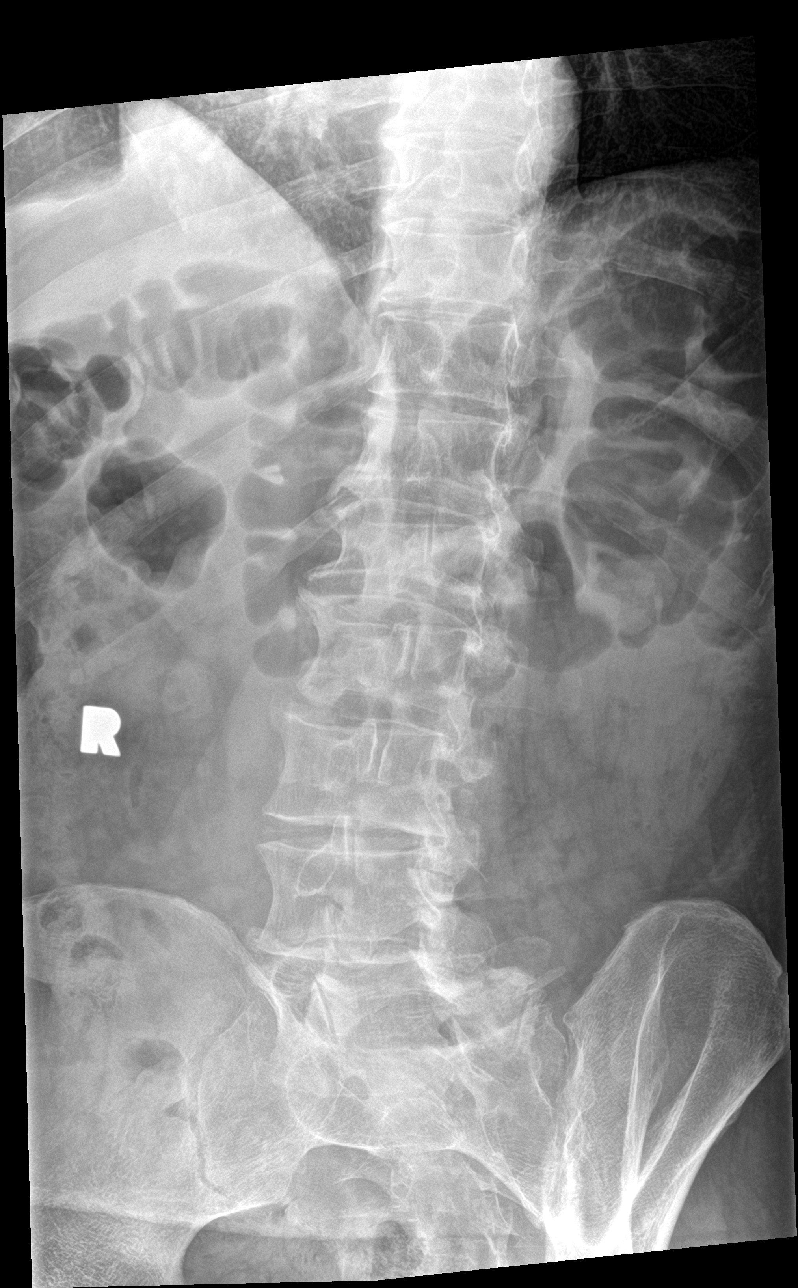

[l-spine obl (2 of 2)]
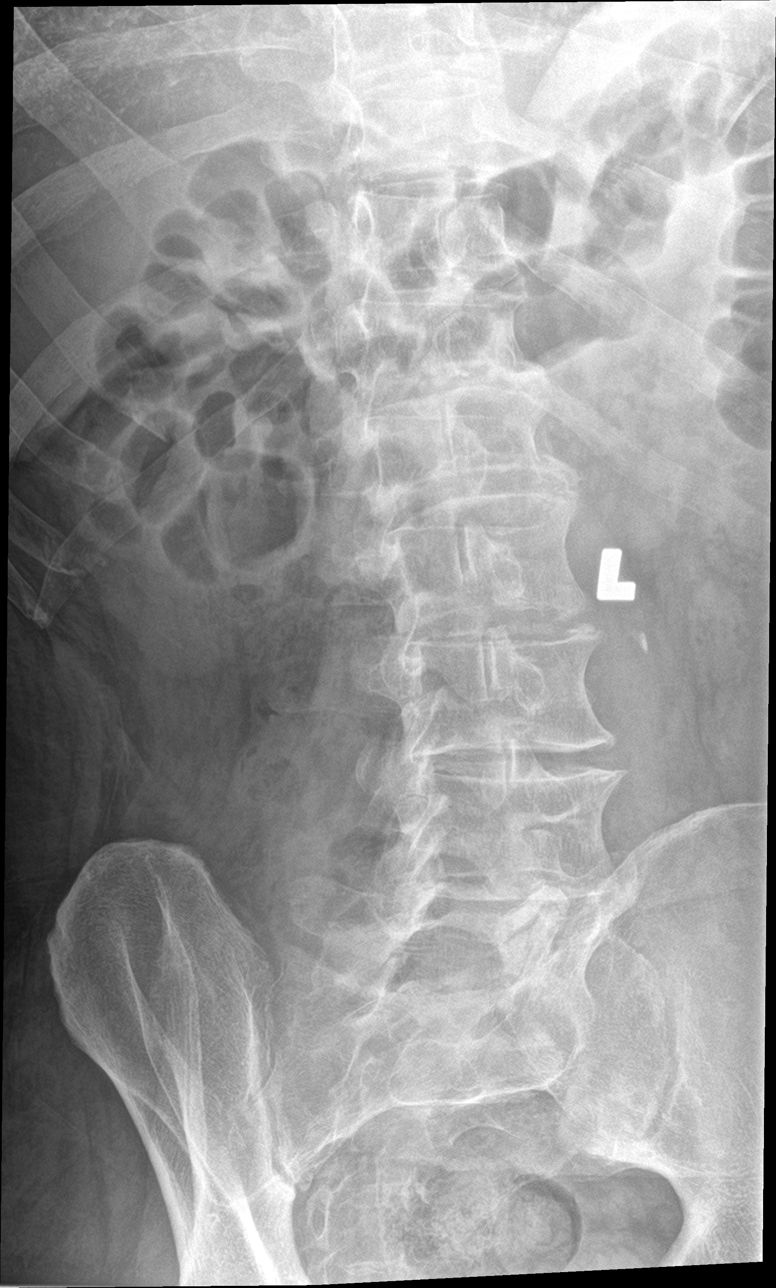

[l-spine lat]
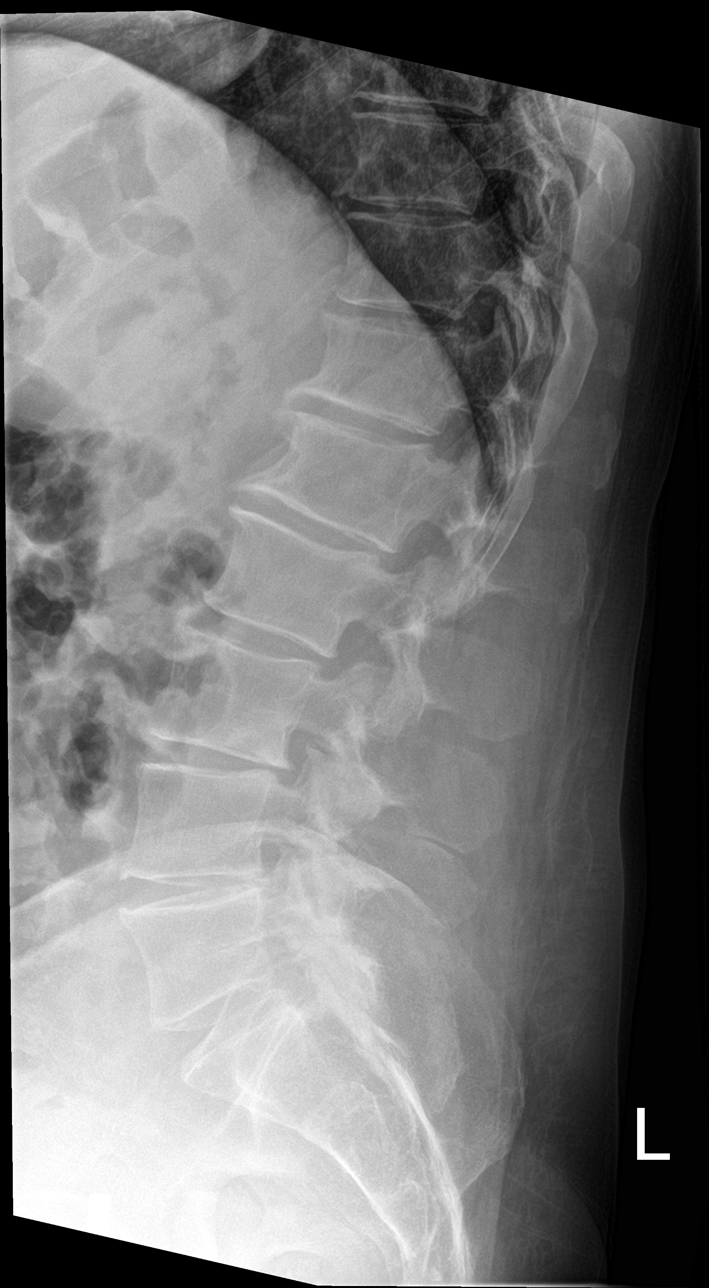

[l-spine spot]
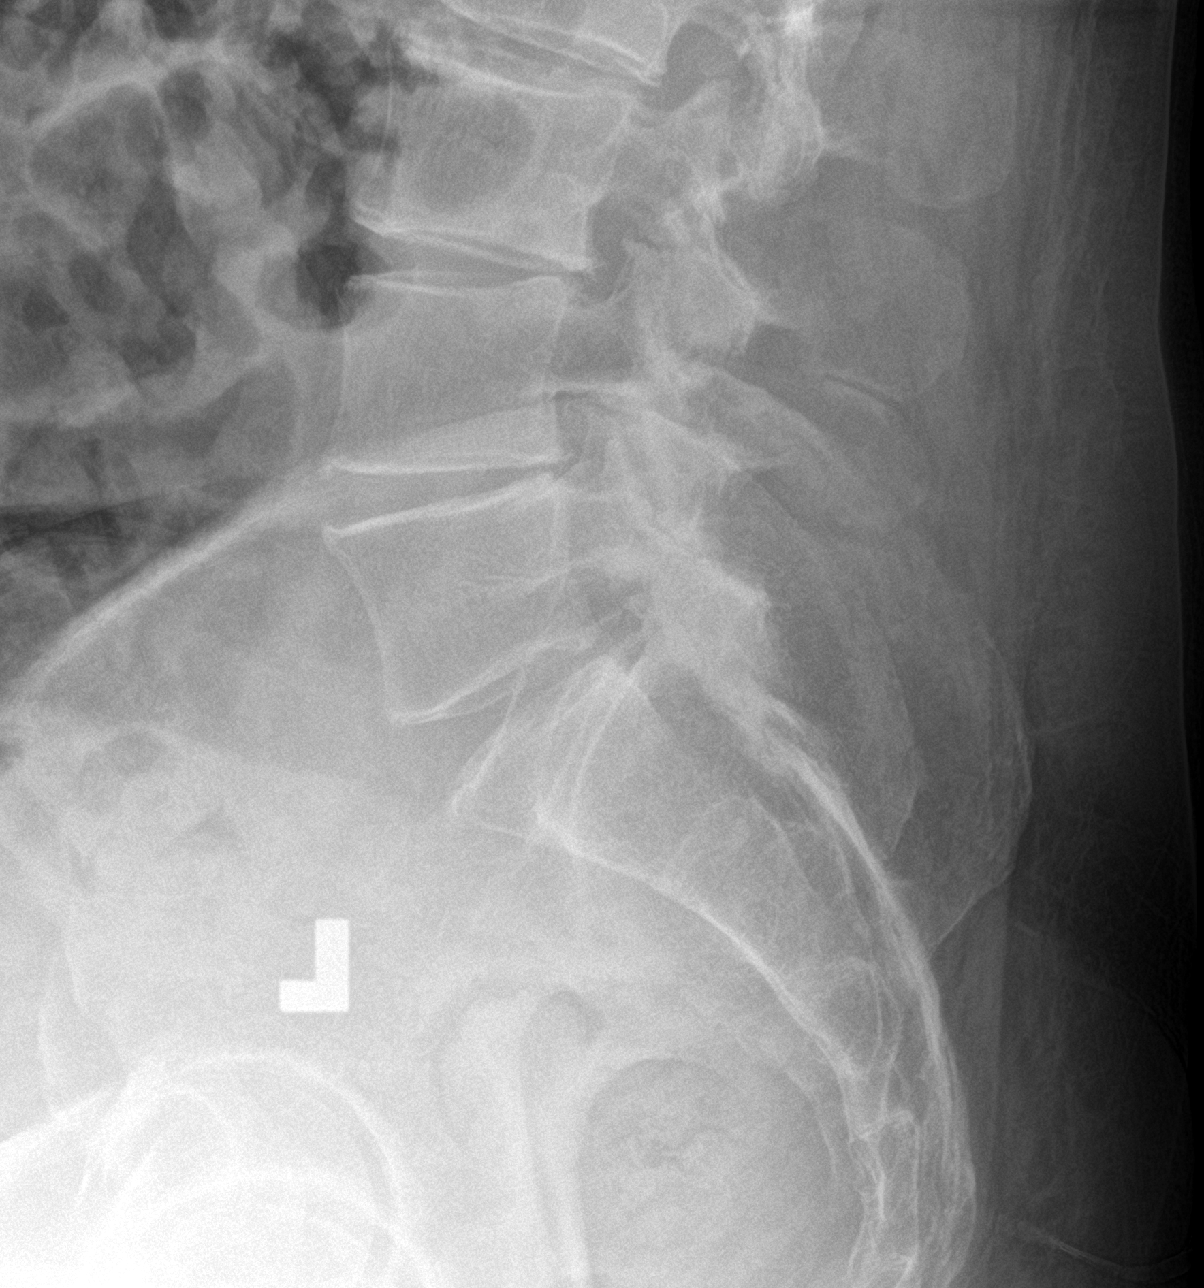

[5 of 5 positions shown; findings below may reference images not displayed]

FINDINGS: Frontal, bilateral oblique, lateral views of the lumbar spine are
obtained. There are 5 non-rib-bearing lumbar type vertebral bodies
in anatomic alignment. Mild diffuse spondylosis with disc space
narrowing and anterior osteophyte formation throughout the lumbar
spine. There is moderate facet hypertrophic changes from L3 through
S1. Sacroiliac joints are normal.
IMPRESSION: 1. Multilevel lumbar spondylosis and facet hypertrophy, greatest at
the lumbosacral junction.
2. No acute bony abnormality.
